# Patient Record
Sex: Female | Born: 1975 | Race: White | Hispanic: No | Smoking: Never smoker
Health system: Southern US, Community
[De-identification: ages and names within clinical notes are randomized; demographics above are authoritative.]

## PROBLEM LIST (undated history)

## (undated) DIAGNOSIS — E039 Hypothyroidism, unspecified: Secondary | ICD-10-CM

## (undated) DIAGNOSIS — E079 Disorder of thyroid, unspecified: Secondary | ICD-10-CM

## (undated) DIAGNOSIS — F329 Major depressive disorder, single episode, unspecified: Secondary | ICD-10-CM

## (undated) DIAGNOSIS — F32A Depression, unspecified: Secondary | ICD-10-CM

---

## 2009-06-19 ENCOUNTER — Ambulatory Visit: Payer: Self-pay | Admitting: Unknown Physician Specialty

## 2012-04-20 ENCOUNTER — Observation Stay: Payer: Self-pay | Admitting: Obstetrics and Gynecology

## 2012-04-27 ENCOUNTER — Inpatient Hospital Stay: Payer: Self-pay | Admitting: Obstetrics and Gynecology

## 2012-04-27 LAB — CBC WITH DIFFERENTIAL/PLATELET
Basophil #: 0 10*3/uL (ref 0.0–0.1)
Basophil %: 0.5 %
Eosinophil #: 0 10*3/uL (ref 0.0–0.7)
HGB: 12.7 g/dL (ref 12.0–16.0)
Lymphocyte #: 1.1 10*3/uL (ref 1.0–3.6)
Lymphocyte %: 14 %
MCV: 93 fL (ref 80–100)
Monocyte #: 0.5 x10 3/mm (ref 0.2–0.9)
Monocyte %: 6.2 %
Neutrophil #: 6.3 10*3/uL (ref 1.4–6.5)
Neutrophil %: 78.9 %
Platelet: 157 10*3/uL (ref 150–440)
RBC: 3.96 10*6/uL (ref 3.80–5.20)
WBC: 8 10*3/uL (ref 3.6–11.0)

## 2015-03-03 NOTE — H&P (Signed)
L&D Evaluation:  History:   HPI 39 yo G3P0020 @ 38.2wks EDC 05/09/12 by LMP presents for evaluation of possible rupture of membranes. +FM, irregular contractions, no VB.  Preg c/b hypothyroidism.    Presents with leaking fluid    Patient's Medical History Thyroid Disease    Patient's Surgical History D&C    Medications Synthroid 123mcg    Allergies NKDA    Social History none    Family History DM, Htn, breast CA   ROS:   ROS All systems were reviewed.  HEENT, CNS, GI, GU, Respiratory, CV, Renal and Musculoskeletal systems were found to be normal.   Exam:   Vital Signs stable    General no apparent distress    Mental Status clear    Abdomen gravid, non-tender    Estimated Fetal Weight Average for gestational age    Fetal Position vtx    Pelvic 4cm per nusing staff    Mebranes Ruptured    Description clear    FHT normal rate with no decels    Ucx irregular   Impression:   Impression active labor   Plan:   Plan EFM/NST, monitor contractions and for cervical change    Comments - Gross ruputer noted, admit for PROM, if no cervical change or contraction do not increase will start pitocin   Electronic Signatures: Dorthula Nettles (MD)  (Signed 05-Jul-13 10:40)  Authored: L&D Evaluation   Last Updated: 05-Jul-13 10:40 by Dorthula Nettles (MD)

## 2015-03-03 NOTE — H&P (Signed)
L&D Evaluation:  History:   HPI 39 yo G3P0020 @ 37.2wks EDC 05/09/12 by LMP presents with vaginal bleeding.  She noted a "smear" of blood when wiping on 2 occasions today.  Denies recent intercourse.  Has not had a recent cervical exam.  +FM, no contractions.  Preg c/b hypothyroidism.    Presents with vaginal bleeding    Patient's Medical History Thyroid Disease    Patient's Surgical History Wisdom teeth    Medications Synthroid 160mcg    Allergies NKDA    Social History none    Family History DM, Htn, breast CA   ROS:   ROS All systems were reviewed.  HEENT, CNS, GI, GU, Respiratory, CV, Renal and Musculoskeletal systems were found to be normal.   Exam:   Vital Signs stable    General no apparent distress    Mental Status clear    Abdomen gravid, non-tender    Pelvic Deferred    Mebranes Intact    FHT Baseline 145, having variable-appearing decels beginning after contraction peak.  NST otherwise reactive.    Ucx irregular    Skin dry   Impression:   Impression 37.2wk IUP, vaginal bleeding   Plan:   Plan EFM/NST    Comments Will perform CST.  Disc POC with pt and husband.  If neg, will d/c home.  If pos, will admit for IOL.   Electronic Signatures: Donzetta Matters (MD)  (Signed 28-Jun-13 22:39)  Authored: L&D Evaluation   Last Updated: 28-Jun-13 22:39 by Donzetta Matters (MD)

## 2015-12-29 DIAGNOSIS — F32A Depression, unspecified: Secondary | ICD-10-CM | POA: Insufficient documentation

## 2015-12-29 DIAGNOSIS — E039 Hypothyroidism, unspecified: Secondary | ICD-10-CM | POA: Insufficient documentation

## 2015-12-30 DIAGNOSIS — L910 Hypertrophic scar: Secondary | ICD-10-CM | POA: Insufficient documentation

## 2015-12-30 DIAGNOSIS — D239 Other benign neoplasm of skin, unspecified: Secondary | ICD-10-CM | POA: Insufficient documentation

## 2016-10-09 ENCOUNTER — Ambulatory Visit
Admission: EM | Admit: 2016-10-09 | Discharge: 2016-10-09 | Disposition: A | Discharge status: Home / Self Care | Payer: BC Managed Care – PPO | Attending: Family Medicine | Admitting: Family Medicine

## 2016-10-09 DIAGNOSIS — S61311A Laceration without foreign body of left index finger with damage to nail, initial encounter: Secondary | ICD-10-CM | POA: Diagnosis not present

## 2016-10-09 HISTORY — DX: Disorder of thyroid, unspecified: E07.9

## 2016-10-09 HISTORY — DX: Depression, unspecified: F32.A

## 2016-10-09 HISTORY — DX: Major depressive disorder, single episode, unspecified: F32.9

## 2016-10-09 NOTE — Discharge Instructions (Signed)
Follow up in 7 days for suture removal  Take care  Dr. Lacinda Axon

## 2016-10-09 NOTE — ED Provider Notes (Signed)
[facility]    CSN: [id number] Arrival date & time: 10/09/16  1431  History   Chief Complaint Chief Complaint  Patient presents with  . Extremity Laceration   HPI 40 year old female presents with an extremity laceration.  Patient states that she was cutting a red pepper earlier today around 2 PM and was not paying close attention and cut the tip of her left index finger. She came indirectly for evaluation. Her wound was cleaned and the bleeding was controlled with pressure. She reports associated moderate pain. No reports of numbness or tingling. Clean wound from the result of a cleaning instrument. No other complaints or concerns at this time.  Past Medical History:  Diagnosis Date  . Depression   . Thyroid disease    History reviewed. No pertinent surgical history.  OB History    No data available     Home Medications    Prior to Admission medications   Medication Sig Start Date End Date Taking? Authorizing Provider  buPROPion (WELLBUTRIN) 100 MG tablet Take 100 mg by mouth 2 (two) times daily.   Yes Historical Provider, MD  escitalopram (LEXAPRO) 20 MG tablet Take 20 mg by mouth daily.   Yes Historical Provider, MD  levothyroxine (SYNTHROID, LEVOTHROID) 100 MCG tablet Take 100 mcg by mouth daily before breakfast.   Yes Historical Provider, MD  Multiple Vitamin (MULTIVITAMIN) tablet Take 1 tablet by mouth daily.   Yes Historical Provider, MD    Family History History reviewed. No pertinent family history.  Social History Social History  Substance Use Topics  . Smoking status: Never Smoker  . Smokeless tobacco: Never Used  . Alcohol use Yes     Comment: social   Allergies   Patient has no known allergies.  Review of Systems Review of Systems  Constitutional: Negative.   Skin: Positive for wound.  All other systems reviewed and are negative.  Physical Exam Triage Vital Signs ED Triage Vitals  Enc Vitals Group     BP 10/09/16 1449 123/70   Pulse Rate 10/09/16 1449 71     Resp 10/09/16 1449 16     Temp 10/09/16 1449 98.3 F (36.8 C)     Temp Source 10/09/16 1449 Oral     SpO2 10/09/16 1449 99 %     Weight 10/09/16 1446 150 lb (68 kg)     Height 10/09/16 1446 5\' 7"  (1.702 m)     Head Circumference --      Peak Flow --      Pain Score 10/09/16 1448 8     Pain Loc --      Pain Edu? --      Excl. in [place]? --    Updated Vital Signs BP 123/70 (BP Location: Right Arm)   Pulse 71   Temp 98.3 F (36.8 C) (Oral)   Resp 16   Ht 5\' 7"  (1.702 m)   Wt 150 lb (68 kg)   LMP 09/19/2016   SpO2 99%   BMI 23.49 kg/m    Physical Exam  Constitutional: She is oriented to person, place, and time. She appears well-developed. No distress.  HENT:  Head: Normocephalic and atraumatic.  Eyes: Conjunctivae are normal.  Neck: Normal range of motion.  Pulmonary/Chest: Effort normal.  Abdominal: She exhibits no distension.  Musculoskeletal: Normal range of motion.  Neurological: She is alert and oriented to person, place, and time.  Skin: Skin is warm. Capillary refill takes less than 2 seconds.  Left 1st digit -  curvilinear superficial laceration which affects the nail as well. Medial side of the left first digit just above the DIP joint. Approximately 4 mm deep. Affects the lateral nail as well.  Psychiatric: She has a normal mood and affect.  Vitals reviewed.  UC Treatments / Results  Labs (all labs ordered are listed, but only abnormal results are displayed) Labs Reviewed - No data to display  EKG  EKG Interpretation None      Radiology No results found.  Procedures .Marland KitchenLaceration Repair Date/Time: 10/09/2016 3:54 PM Performed by: Coral Spikes Authorized by: Coral Spikes   Consent:    Consent obtained:  Verbal   Consent given by:  Patient   Alternatives discussed:  Observation and no treatment Anesthesia (see MAR for exact dosages):    Anesthesia method:  Local infiltration   Local anesthetic:  Lidocaine 1% w/o  epi Laceration details:    Location:  Finger   Finger location:  L index finger   Length (cm):  1   Depth (mm):  4 Repair type:    Repair type:  Simple Pre-procedure details:    Preparation:  Patient was prepped and draped in usual sterile fashion Exploration:    Hemostasis achieved with:  Direct pressure   Contaminated: no   Treatment:    Area cleansed with:  Betadine   Amount of cleaning:  Standard Skin repair:    Repair method:  Sutures   Suture size:  5-0   Suture material:  Nylon   Suture technique:  Simple interrupted   Number of sutures:  3 Approximation:    Approximation:  Close Post-procedure details:    Dressing:  Antibiotic ointment, non-adherent dressing and splint for protection   Patient tolerance of procedure:  Tolerated well, no immediate complications   (including critical care time)  Medications Ordered in UC Medications - No data to display   Initial Impression / Assessment and Plan / UC Course  I have reviewed the triage vital signs and the nursing notes.  Pertinent labs & imaging results that were available during my care of the patient were reviewed by me and considered in my medical decision making (see chart for details).  Clinical Course   40 year old female presents with a left index finger laceration. Take to the nail bed as well. Wound closed as above. Follow up in 7 days for suture removal.  Final Clinical Impressions(s) / UC Diagnoses   Final diagnoses:  Laceration of left index finger without foreign body with damage to nail, initial encounter   New Prescriptions Discharge Medication List as of 10/09/2016  3:39 PM       Coral Spikes, DO 10/09/16 1604

## 2016-10-09 NOTE — ED Triage Notes (Signed)
Pt reports cutting her left index finger with a kitchen knife while cutting a pepper. Bleeding controlled in triage. Pain 8/10.

## 2016-10-14 ENCOUNTER — Ambulatory Visit
Admission: EM | Admit: 2016-10-14 | Discharge: 2016-10-14 | Disposition: A | Discharge status: Home / Self Care | Payer: BC Managed Care – PPO

## 2016-10-16 ENCOUNTER — Ambulatory Visit
Admission: EM | Admit: 2016-10-16 | Discharge: 2016-10-16 | Disposition: A | Discharge status: Home / Self Care | Payer: BC Managed Care – PPO

## 2016-10-16 DIAGNOSIS — Z4802 Encounter for removal of sutures: Secondary | ICD-10-CM | POA: Diagnosis not present

## 2016-10-16 NOTE — ED Triage Notes (Signed)
Pt with left index sutures x 3 intact. No sx of infection. Denies pain

## 2016-10-16 NOTE — ED Notes (Signed)
3 sutures removed from left index finger. Bacitracin and bandaid applied

## 2017-02-25 DIAGNOSIS — G2581 Restless legs syndrome: Secondary | ICD-10-CM | POA: Insufficient documentation

## 2017-10-23 ENCOUNTER — Other Ambulatory Visit: Payer: Self-pay

## 2017-10-23 ENCOUNTER — Ambulatory Visit (INDEPENDENT_AMBULATORY_CARE_PROVIDER_SITE_OTHER): Payer: BC Managed Care – PPO

## 2017-10-23 ENCOUNTER — Ambulatory Visit
Admission: EM | Admit: 2017-10-23 | Discharge: 2017-10-23 | Disposition: A | Discharge status: Home / Self Care | Payer: BC Managed Care – PPO | Attending: Emergency Medicine | Admitting: Emergency Medicine

## 2017-10-23 ENCOUNTER — Encounter: Payer: Self-pay | Admitting: Emergency Medicine

## 2017-10-23 DIAGNOSIS — S29011A Strain of muscle and tendon of front wall of thorax, initial encounter: Secondary | ICD-10-CM

## 2017-10-23 DIAGNOSIS — R05 Cough: Secondary | ICD-10-CM

## 2017-10-23 MED ORDER — NAPROXEN 500 MG PO TABS: 500.0000 mg | ORAL_TABLET | Freq: Two times a day (BID) | ORAL | 0 refills | Status: AC

## 2017-10-23 NOTE — Discharge Instructions (Signed)
Heat alternating with ice 20 minutes out of every 2 hours 4-5 times daily for pain.  Be certain to cough and deep breathe several times each hour with holding of the rib area with your hand.

## 2017-10-23 NOTE — ED Provider Notes (Signed)
[facility]    CSN: [id number] Arrival date & time: 10/23/17  1019     History   Chief Complaint Chief Complaint  Patient presents with  . Chest Pain    APPT rib pain    HPI Mallory Simmons is a 41 y.o. female.   HPI  Is a 60-year-old female who presents with right-sided rib pain indicating lower ribs just below her right breast radicular to anterior axillary line.  He has had had pain for about 4 days he has not been taking deep breaths the discomfort.  Had a cough for over a month.  She was diagnosed with bronchitis her PCP on or about 10/09/2017.  She is a non-smoker.  She states that she is comfortable at rest but whenever she coughs or moves a certain way she will have the discomfort.  No fever or chills.  The cough is productive of green sputum.       Past Medical History:  Diagnosis Date  . Depression   . Thyroid disease     There are no active problems to display for this patient.   History reviewed. No pertinent surgical history.  OB History    No data available       Home Medications    Prior to Admission medications   Medication Sig Start Date End Date Taking? Authorizing Provider  DULoxetine (CYMBALTA) 60 MG capsule Take 60 mg by mouth daily.   Yes [provider]  levothyroxine (SYNTHROID, LEVOTHROID) 100 MCG tablet Take 100 mcg by mouth daily before breakfast.   Yes [provider]  Multiple Vitamin (MULTIVITAMIN) tablet Take 1 tablet by mouth daily.   Yes [provider]  naproxen (NAPROSYN) 500 MG tablet Take 1 tablet (500 mg total) by mouth 2 (two) times daily with a meal. 10/23/17   Lorin Picket, PA-C    Family History Family History  Problem Relation Age of Onset  . Breast cancer Mother   . Hypertension Mother   . Prostate cancer Father     Social History Social History   Tobacco Use  . Smoking status: Never Smoker  . Smokeless tobacco: Never Used  Substance Use Topics  . Alcohol use:  Yes    Comment: social  . Drug use: No     Allergies   Patient has no known allergies.   Review of Systems Review of Systems  Constitutional: Positive for activity change. Negative for chills, fatigue and fever.  Cardiovascular: Positive for chest pain.  All other systems reviewed and are negative.    Physical Exam Triage Vital Signs ED Triage Vitals [10/23/17 1031]  Enc Vitals Group     BP 118/84     Pulse Rate 92     Resp 16     Temp 98.1 F (36.7 C)     Temp Source Oral     SpO2 100 %     Weight 154 lb (69.9 kg)     Height 5\' 7"  (1.702 m)     Head Circumference      Peak Flow      Pain Score 2     Pain Loc      Pain Edu?      Excl. in [place]?    No data found.  Updated Vital Signs BP 118/84 (BP Location: Left Arm)   Pulse 92   Temp 98.1 F (36.7 C) (Oral)   Resp 16   Ht 5\' 7"  (1.702 m)   Wt  154 lb (69.9 kg)   LMP 10/17/2017 Comment: denies preg  SpO2 100%   BMI 24.12 kg/m   Visual Acuity Right Eye Distance:   Left Eye Distance:   Bilateral Distance:    Right Eye Near:   Left Eye Near:    Bilateral Near:     Physical Exam  Constitutional: She is oriented to person, place, and time. She appears well-developed and well-nourished.  Non-toxic appearance. She does not appear ill. No distress.  HENT:  Head: Normocephalic.  Eyes: Pupils are equal, round, and reactive to light.  Neck: Normal range of motion.  Pulmonary/Chest: Effort normal and breath sounds normal.  Musculoskeletal: Normal range of motion.  Examination of the chest was performed with Nevin Bloodgood, CMA as Education officer, museum.  Patient has discomfort with a compression of the sternum with radiation of the pain along the right lower ribs just inferior to the breast in the mid clavicle to anterior axillary line.  Maximal tenderness is also in this area but mostly over the anterior axillary line.  There is no crepitus to auscultation over the area of maximal tenderness.  Neurological: She is alert  and oriented to person, place, and time.  Skin: Skin is warm and dry.  Psychiatric: She has a normal mood and affect. Her behavior is normal.  Nursing note and vitals reviewed.    UC Treatments / Results  Labs (all labs ordered are listed, but only abnormal results are displayed) Labs Reviewed - No data to display  EKG  EKG Interpretation None       Radiology Dg Ribs Unilateral W/chest Right  Result Date: 10/23/2017 CLINICAL DATA:  One month history of bronchitis with persistent productive cough. Pleuritic right insurer lateral lower ribcage pain related to coughing episode. EXAM: RIGHT RIBS AND CHEST - 3+ VIEW COMPARISON:  None in PACs FINDINGS: The lungs are well-expanded and clear. There is no pleural effusion, pneumothorax, or pneumomediastinum. The heart and mediastinal structures are normal. There is gentle curvature of the midthoracic spine convex toward the right. Right rib detail views include a metallic BB over the symptomatic lower lateral ribs. Deep to this no acute displaced or nondisplaced fracture is observed. IMPRESSION: There is no pneumonia nor other acute cardiopulmonary abnormality. No right rib fracture is observed. Electronically Signed   By: David  Martinique M.D.   On: 10/23/2017 11:20    Procedures Procedures (including critical care time)  Medications Ordered in UC Medications - No data to display   Initial Impression / Assessment and Plan / UC Course  I have reviewed the triage vital signs and the nursing notes.  Pertinent labs & imaging results that were available during my care of the patient were reviewed by me and considered in my medical decision making (see chart for details).     Plan: 1. Test/x-ray results and diagnosis reviewed with patient 2. rx as per orders; risks, benefits, potential side effects reviewed with patient 3. Recommend supportive treatment with heat alternating with ice over the area for comfort.  He is instructed in proper  cough and deep breathe pulmonary toilet using her hand for support over the area of maximal tenderness.  Her chest x-ray was reviewed with the patient.  She continues to have problems she should follow-up with her primary care physician.  There is no evidence of pneumothorax hemothorax or pneumonia present. 4. F/u prn if symptoms worsen or don't improve   Final Clinical Impressions(s) / UC Diagnoses   Final diagnoses:  Intercostal muscle  strain, initial encounter    ED Discharge Orders        Ordered    naproxen (NAPROSYN) 500 MG tablet  2 times daily with meals     10/23/17 1137       Controlled Substance Prescriptions [place] Controlled Substance Registry consulted? Not Applicable   Lorin Picket, PA-C 10/23/17 1144

## 2017-10-23 NOTE — ED Notes (Signed)
Bed: MUC03 Expected date: 10/23/17 Expected time: 10:29 AM Means of arrival:  Comments: Mallory Simmons online appt.

## 2017-10-23 NOTE — ED Triage Notes (Signed)
Patient in today c/o right sided rib pain and unable to take a deep breath without pain x 4 days. Patient was diagnosed with bronchitis 10/09/17 at her PCP. She states she started with a cough about 1 week prior to that.

## 2017-12-08 ENCOUNTER — Other Ambulatory Visit: Payer: Self-pay | Admitting: Obstetrics and Gynecology

## 2019-03-19 IMAGING — CR DG RIBS W/ CHEST 3+V*R*
5 series · 5 of 5 positions shown · non-contrast
Comparison: None in PACs

CLINICAL DATA: One month history of bronchitis with persistent
productive cough. Pleuritic right insurer lateral lower ribcage pain
related to coughing episode.

EXAM:
RIGHT RIBS AND CHEST - 3+ VIEW

[chest pa]
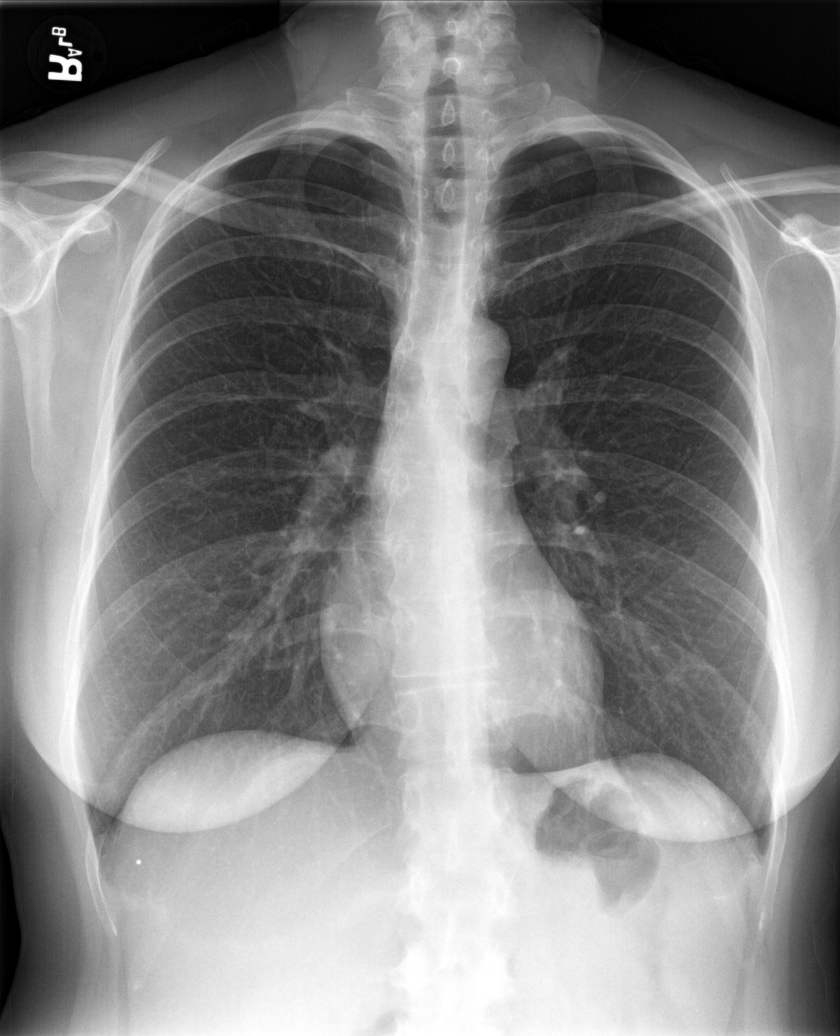

[rib pa (1 of 2)]
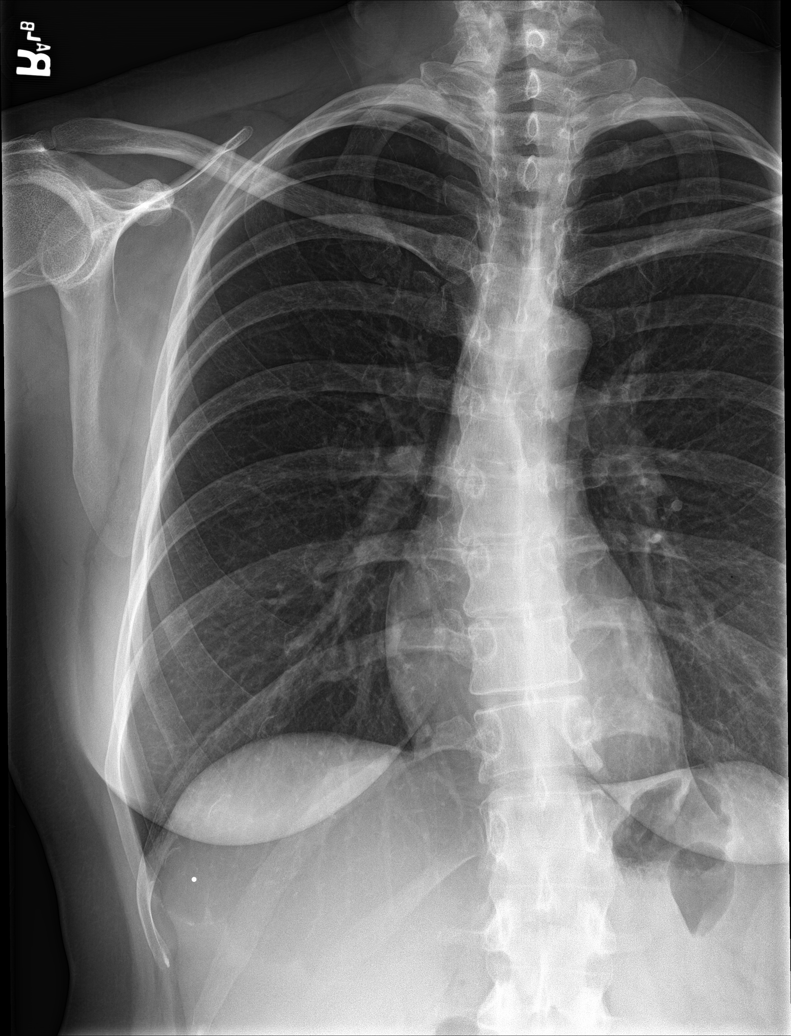

[rib pa (2 of 2)]
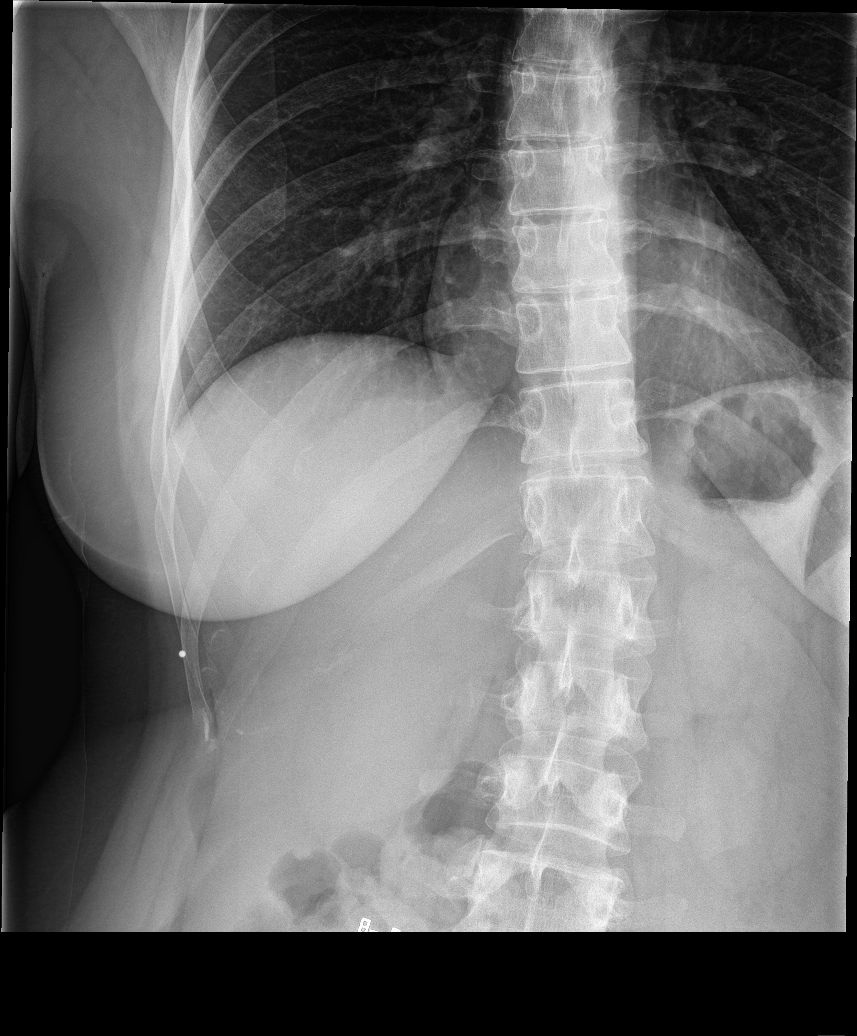

[rib obl (1 of 2)]
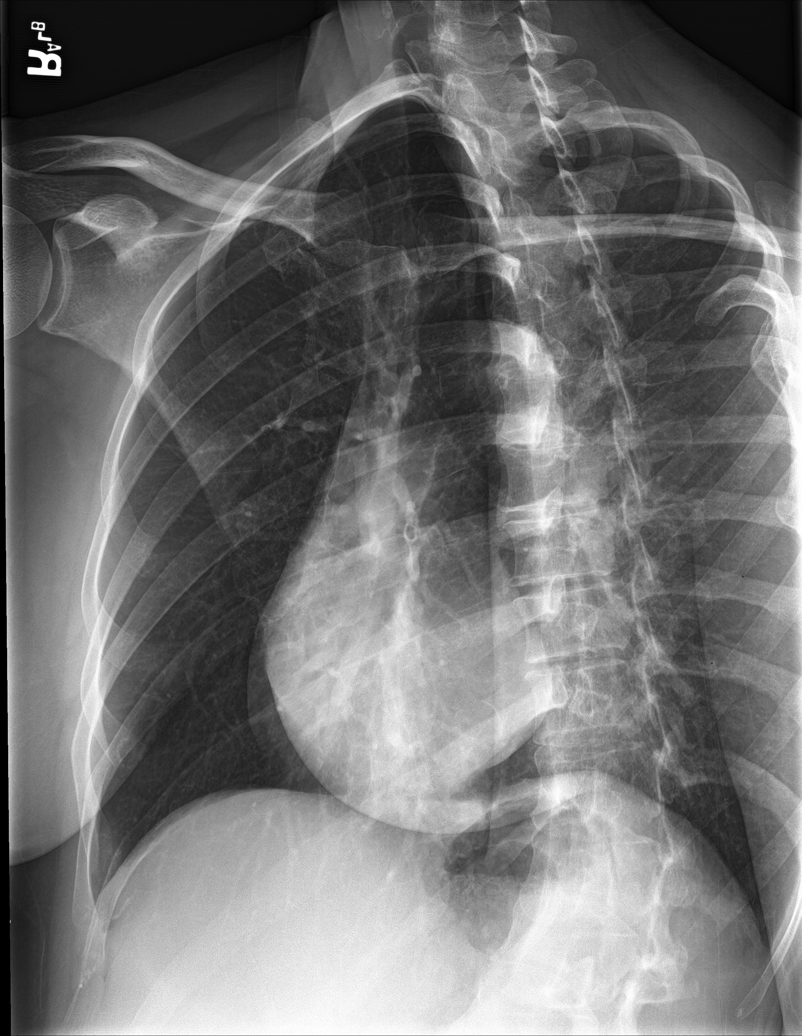

[rib obl (2 of 2)]
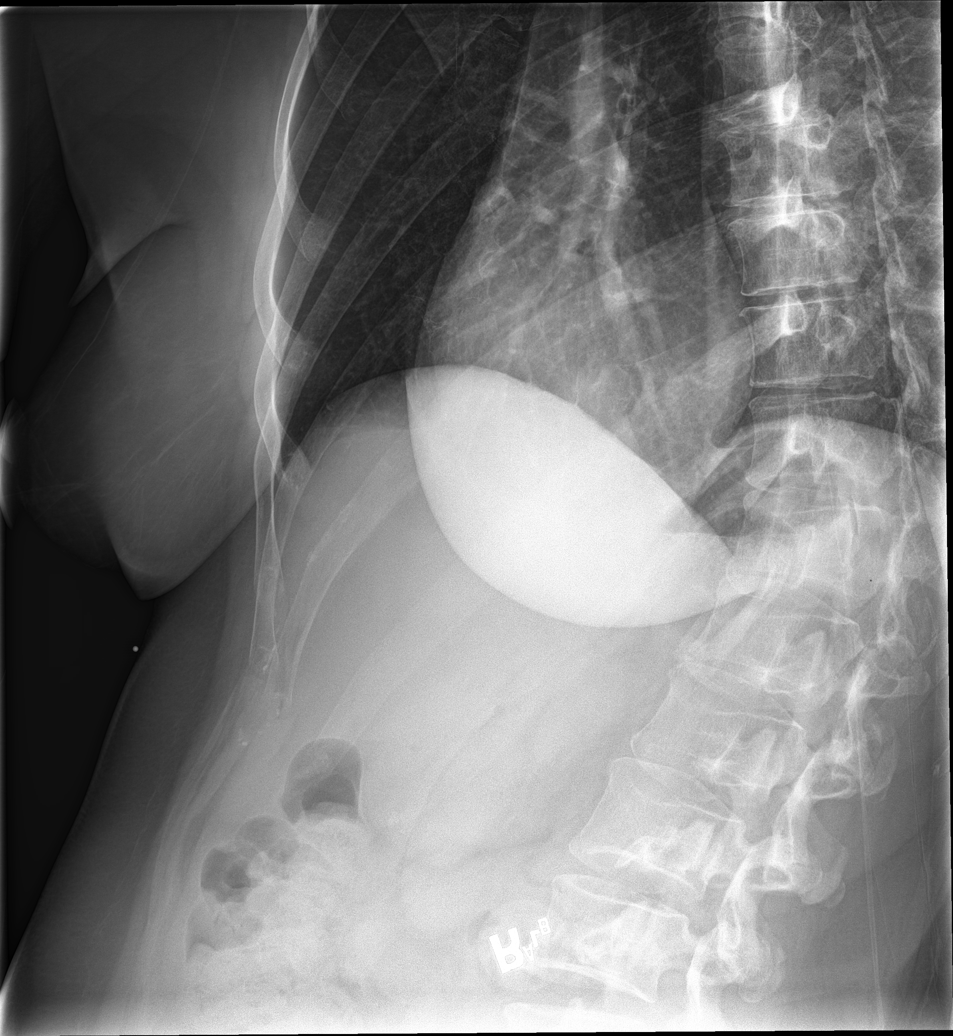

[5 of 5 positions shown; findings below may reference images not displayed]

FINDINGS: The lungs are well-expanded and clear. There is no pleural effusion,
pneumothorax, or pneumomediastinum. The heart and mediastinal
structures are normal. There is gentle curvature of the midthoracic
spine convex toward the right.

Right rib detail views include a metallic BB over the symptomatic
lower lateral ribs. Deep to this no acute displaced or nondisplaced
fracture is observed.
IMPRESSION: There is no pneumonia nor other acute cardiopulmonary abnormality.

No right rib fracture is observed.

## 2019-06-19 DIAGNOSIS — Z1589 Genetic susceptibility to other disease: Secondary | ICD-10-CM | POA: Insufficient documentation

## 2020-07-21 DIAGNOSIS — L709 Acne, unspecified: Secondary | ICD-10-CM | POA: Insufficient documentation

## 2020-10-02 DIAGNOSIS — N924 Excessive bleeding in the premenopausal period: Secondary | ICD-10-CM | POA: Insufficient documentation

## 2021-09-20 ENCOUNTER — Other Ambulatory Visit: Payer: Self-pay

## 2021-09-20 ENCOUNTER — Ambulatory Visit: Payer: BC Managed Care – PPO | Attending: Family Medicine

## 2021-09-20 DIAGNOSIS — M6281 Muscle weakness (generalized): Secondary | ICD-10-CM | POA: Diagnosis present

## 2021-09-20 DIAGNOSIS — M25551 Pain in right hip: Secondary | ICD-10-CM | POA: Diagnosis present

## 2021-09-20 NOTE — Therapy (Signed)
[facility] [address] [place], [place], [address] Phone: [phone]   Fax:  [phone]  Physical Therapy Evaluation  Patient Details  Name: Mallory Simmons MRN: [id number] Date of Birth: Oct 10, 1976 Referring Provider (PT): Dr Sara Chu  Encounter Date: 09/20/2021   PT End of Session - 09/20/21 1214     Visit Number 1    Number of Visits 7    Date for PT Re-Evaluation 11/01/21    Authorization Type eval: 09/20/21, [place]  Ded[place] and OOP met- covered @ 100% until 10/23/21  no auth req    PT Start Time 1020    PT Stop Time 1110    PT Time Calculation (min) 50 min    Activity Tolerance Patient tolerated treatment well    Behavior During Therapy [facility] for tasks assessed/performed             Past Medical History:  Diagnosis Date   Depression    Thyroid disease     History reviewed. No pertinent surgical history.  There were no vitals filed for this visit.    Subjective Assessment - 09/20/21 1134     Subjective R hip pain    Pertinent History During the night of 09/12/21 pt was getting up to go to the bathroom. She stepped with left leg to avoid her cat and she hyperextend her R hip. She had immediate pain in her R hip but no popping or locking. Immediately afterward she had difficulty putting weight on her RLE. She also had warmth in her right hip but no swelling or bruising. She denies any weakness in her right hip or numbness/tingling in RLE. She saw her PCP and used ibuprofen regularly all last week. By Saturday her hip was feeling much better. Pt is very active and she likes to run and hike. She also works out with a Physiological scientist twice/week. During personal training she performs a lot of lunges, squats, and RDLs. She does not do plyometric exercises with the trainer. Pt works at [employer] at [employer]. She reports pain in R hip once she stands up if she sits for extended periods of time  at work. No history of prior R hip injury or surgery. Denies back pain but reports diagnosis of mild scoliosis which was noted when she had her epidural during her son's birth. Review of systems is negative for red flags (bowel/bladder changes, saddle paresthesia, personal history of cancer, chills/fever, night sweats, unrelenting pain).    Limitations Other (comment)   running, exercise   How long can you sit comfortably? No limitation only pain when standing after extended period sitting    How long can you walk comfortably? No limitation    Diagnostic tests No imaging    Patient Stated Goals "I want to know if I can do a 5K in December." Patient's main goal is return to regular exercise    Currently in Pain? No/denies    Pain Score 0-No pain   Worst: 2/10, Best: 0/10   Pain Location Hip    Pain Orientation Right;Anterior;Posterior    Pain Descriptors / Indicators Sore;Other (Comment)   "twingy"   Pain Type Acute pain    Pain Radiating Towards Initially some radiating pain down anterior right thigh and into posterior hip but has resolved    Pain Onset In the past 7 days    Pain Frequency Intermittent    Aggravating Factors  Extended sitting, lunging, twisting of  the hip, hip extension    Pain Relieving Factors heat, ibuprofen                [facility] PT Assessment - 09/20/21 1144       Assessment   Medical Diagnosis Inguinal muscle strain    Referring Provider (PT) Dr Sara Chu    Onset Date/Surgical Date 09/12/21    Hand Dominance Right    Next MD Visit As needed    Prior Therapy None for this issue      Precautions   Precautions None      Restrictions   Weight Bearing Restrictions No      Balance Screen   Has the patient fallen in the past 6 months No      [place] residence    Living Arrangements Children    Type of Home Apartment      Prior Function   Level of Independence Independent    Vocation Full time employment     Vocation Requirements Works as a Licensed conveyancer at [employer], running, exercising      Cognition   Overall Cognitive Status Within Functional Limits for tasks assessed              SUBJECTIVE Chief complaint: R hip pain History: During the night of 09/12/21 pt was getting up to go to the bathroom. She stepped with left leg to avoid her cat and she hyperextend her R hip. She had immediate pain in her R hip but no popping or locking. Immediately afterward she had difficulty putting weight on her RLE. She also had warmth in her right hip but no swelling or bruising. She denies any weakness in her right hip or numbness/tingling in RLE. She saw her PCP and used ibuprofen regularly all last week. By Saturday her hip was feeling much better. Pt is very active and she likes to run and hike. She also works out with a Physiological scientist twice/week. During personal training she performs a lot of lunges, squats, and RDLs. She does not do plyometric exercises with the trainer. Pt works at [employer] at [employer]. She reports pain in R hip once she stands up if she sits for extended periods of time at work. No history of prior R hip injury or surgery. Denies back pain but reports diagnosis of mild scoliosis which was noted when she had her epidural during her son's birth. Review of systems is negative for red flags (bowel/bladder changes, saddle paresthesia, personal history of cancer, chills/fever, night sweats, unrelenting pain). Pain location: R hip/groin Pain: Present 0/10, Best 0/10, Worst 2/10 Pain quality: "twingy," sore Radiating pain: No , Initially some radiating pain down anterior right thigh and into posterior hip but has resolved Numbness/Tingling: No 24 hour pain behavior: No pattern Aggravating factors: Extended sitting, lunging, twisting of the hip, hip extension Easing factors: heat, ibuprofen History of prior back injury, pain, surgery, or therapy: No Follow-up  appointment with MD: No, as needed Dominant hand: right Imaging: No  Falls in the last 6 months: No  Occupational demands: Hobbies: Goals: "I want to know if I can do a 5K in December." Patient's main goal is return to regular exercise   OBJECTIVE  Mental Status Patient is oriented to person, place and time.  Recent memory is intact.  Remote memory is intact.  Attention span and concentration are intact.  Expressive speech is intact.  Patient's fund of knowledge  is within normal limits for educational level.   Gross Musculoskeletal Assessment Tremor: None Bulk: Normal Tone: Normal No visible step-off along spinal column, mild L thoracic curve noted with very mild rib elevation   Gait Deferred full gait assessment   Posture Lumbar lordosis: WNL Iliac crest height: equal bilaterally Lumbar lateral shift: negative Lower crossed syndrome (tight hip flexors and erector spinae; weak gluts and abs): negative   AROM (degrees) R/L (all movements include overpressure unless otherwise stated) Lumbar forward flexion (65): WNL Lumbar extension (30): WNL, mild pain in R hip Lumbar lateral flexion (25): R: WNL  L: WNL  Thoracic and Lumbar rotation (30 degrees):  R: WNL L: WNL Hip IR (0-45): WNL bilateral; Hip ER (0-45): WNL bilateral; Hip Flexion (0-125): slightly limited on the R hip secondary to pain  Hip Abduction (0-40): R: slightly limited due to pain, L: WNL *Indicates pain   Repeated Movements Deferred   Strength (out of 5) R/L 4*/4+ Hip flexion 5*/5 Hip ER 5/5 Hip IR 4/4 Hip abduction 4*/4 Hip adduction 4+*/4+ Hip extension 5/5 Knee extension 5/5 Knee flexion 5/5 Ankle dorsiflexion Active bilateral ankle plantarflexion 5 Trunk flexion *Indicates pain   Sensation Grossly intact to light touch bilateral LEs as determined by testing dermatomes L2-S2. Proprioception and hot/cold testing deferred on this date.   Reflexes Deferred   Palpation Graded  on 0-4 scale (0 = no pain, 1 = pain, 2 = pain with wincing/grimacing/flinching, 3 = pain with withdrawal, 4 = unwilling to allow palpation) Location LEFT  RIGHT           Lumbar paraspinals 0 0  Quadratus Lumborum NT NT  Gluteus Maximus 0 1  Gluteus Medius 0 0  Deep hip external rotators 0 1  PSIS 0 0  Fortin's Area (SIJ) 0 1  Greater Trochanter 0 0  Iliopsoas Insertion 0 2  Iliacus 0 2     Muscle Length Hamstrings: Approximately 90 degrees bilaterally, no pain  Ely: WNL Thomas: Deferred Ober: Deferred    Passive Accessory Intervertebral Motion (PAIVM) Pt denies reproduction of back pain with CPA L1-L5 and UPA bilaterally L1-L5. No gross hypomobility noted in lumbar spine    SPECIAL TESTS Lumbar Radiculopathy and Discogenic: Centralization and Peripheralization (SN 92, -LR 0.12): Not examined Slump (SN 83, -LR 0.32): R: Negative L: Negative SLR (SN 92, -LR 0.29): R: Negative L:  Negative Crossed SLR (SP 90): R: Negative L: Negative   Facet Joint: Extension-Rotation (SN 100, -LR 0.0): R: Negative L: Negative   Lumbar Foraminal Stenosis: Lumbar quadrant (SN 70): R: Negative L: Negative   Hip: FABER (SN 81): R: Negative L: Negative FADIR (SN 94): R: Positive L: Negative Hip scour (SN 50): R: Negative L: Negative   SIJ:  Thigh Thrust (SN 88, -LR 0.18) : R: Not examined L: Not examined   Piriformis Syndrome: FAIR Test (SN 88, SP 83): R: Not examined L: Not examined   Functional Tasks Deferred                Objective measurements completed on examination: See above findings.        TREATMENT   Ther-ex  HEP issued and exercises explained and/or demonstrated with return performance by patient and verbal/tactile correction by therapist. Seated R hip FABER and FADIR stretch; Seated clams and adductor squeeze Standing R hip flexor runners stretch;  Instruction about seated march with resistance; Green, blue, and gray therabands  provided;        PT Education -  09/20/21 1153     Education Details Plan of care and HEP    Person(s) Educated Patient    Methods Explanation;Demonstration;Handout    Comprehension Verbalized understanding;Returned demonstration              PT Short Term Goals - 09/20/21 1156       PT SHORT TERM GOAL #1   Title Pt will be independent with HEP to improve strength and decrease R hip pain in order to improve pain-free function at home, work, and with exercise    Time 3    Period Weeks    Status New    Target Date 10/11/21               PT Long Term Goals - 09/20/21 1159       PT LONG TERM GOAL #1   Title Pt will report no further R hip pain with running, hiking, and working out with her personal trainer    Time 6    Period Weeks    Status New    Target Date 11/01/21      PT LONG TERM GOAL #2   Title Pt will increase FOTO to at least 90 in order to demonstrate significant improvement in function related to her R hip pain    Baseline 09/20/21: 72    Time 6    Period Weeks    Status New    Target Date 11/01/21      PT LONG TERM GOAL #3   Title Pt will increase pain free R hip abduction strength by at least 1/2 MMT grade in order to demonstrate improvement in strength and function.    Baseline 09/20/21: 4/5 and painful    Time 6    Period Weeks    Status New    Target Date 11/01/21      PT LONG TERM GOAL #4   Title Pt will increase LEFS by at least 9 points in order to demonstrate significant improvement in lower extremity function related to her R hip pain.    Baseline 09/20/21: 69/80    Time 6    Period Weeks    Status New    Target Date 11/01/21                    Plan - 09/20/21 1154     Clinical Impression Statement Pt is a pleasant 45 year-old female referred for R inguinal muscle strain. Pt is already reporting significant improvement in her symptoms since the injury. Examination today reveals tenderness to distal iliopsoas  insertion as well as pain with palpation to medial thigh (adductors) and deep posterior right hip (external rotators). Painful resisted R hip flexion, adduction, external rotation, and extension consistent with muscle strains. Pt also reports pain with passive R hip FADIR and passive abduction (pain reported in inner thigh). No focal deficits in strength or sensation with the exception of some weakness in R hip flexion related to pain. Pt presents with R hip pain and will benefit from skilled PT services to return to pain-free function at home, work, and with leisure activities such as running, hiking, and exercising.    Personal Factors and Comorbidities Comorbidity 1    Comorbidities Depression    Examination-Activity Limitations Squat    Examination-Participation Restrictions Community Activity;Other;Occupation   Exercise/running/hiking   Stability/Clinical Decision Making Stable/Uncomplicated    Clinical Decision Making Low    Rehab Potential Excellent    PT Frequency 1x / week  PT Duration 6 weeks    PT Treatment/Interventions ADLs/Self Care Home Management;Aquatic Therapy;Biofeedback;Canalith Repostioning;Cryotherapy;Electrical Stimulation;Iontophoresis 35m/ml Dexamethasone;Moist Heat;Traction;Ultrasound;Gait training;Stair training;Functional mobility training;Therapeutic activities;Therapeutic exercise;Balance training;Neuromuscular re-education;Patient/family education;Manual techniques;Passive range of motion;Dry needling;Vestibular;Spinal Manipulations;Joint Manipulations    PT Next Visit Plan Review HEP, progress R hip manual techniques and strengthening    PT Home Exercise Plan Access Code: [email or web address]    Consulted and Agree with Plan of Care Patient             Patient will benefit from skilled therapeutic intervention in order to improve the following deficits and impairments:  Pain, Decreased strength  Visit Diagnosis: Pain in right hip  Muscle weakness  (generalized)     Problem List There are no problems to display for this patient.  JPhillips GroutPT, DPT, GCS  Logyn Kendrick, PT 09/20/2021, 4:15 PM  [place] [facility] [place] [address]Phone: [phone]  Fax:  [phone] Name: Mallory DONAMRN: [id number] of Birth: 123-Mar-1977

## 2021-09-20 NOTE — Patient Instructions (Signed)
Access Code: [email or web address] URL: [email or web address] Date: 09/20/2021 Prepared by: Roxana Hires  Exercises Standing Hip Flexor Stretch - 2-3 x daily - 7 x weekly - 3 reps - 30-45s hold Seated Figure 4 Piriformis Stretch (Mirrored) - 2-3 x daily - 7 x weekly - 3 reps - 30-45s hold Seated Piriformis Stretch (Mirrored) - 2-3 x daily - 7 x weekly - 3 reps - 30-45s hold Seated Hip Adduction Isometrics with Ball - 1 x daily - 7 x weekly - 2 sets - 10 reps - 3s hold Seated Hip Abduction with Resistance - 1 x daily - 7 x weekly - 2 sets - 10 reps - 3s hold Seated March with Resistance - 1 x daily - 7 x weekly - 2 sets - 10 reps - 3s hold

## 2021-09-26 NOTE — Addendum Note (Signed)
Addended by: Roxana Hires D on: 09/26/2021 02:56 PM   Modules accepted: Orders

## 2021-09-27 ENCOUNTER — Other Ambulatory Visit: Payer: Self-pay

## 2021-09-27 ENCOUNTER — Ambulatory Visit: Payer: BC Managed Care – PPO | Attending: Family Medicine | Admitting: Physical Therapy

## 2021-09-27 DIAGNOSIS — M25551 Pain in right hip: Secondary | ICD-10-CM | POA: Insufficient documentation

## 2021-09-27 DIAGNOSIS — M6281 Muscle weakness (generalized): Secondary | ICD-10-CM | POA: Diagnosis present

## 2021-09-27 NOTE — Therapy (Signed)
[facility] [address] [place], [place], [address] Phone: [phone]   Fax:  [phone]  Physical Therapy Treatment  Patient Details  Name: Mallory Simmons MRN: [id number] Date of Birth: [birth year] Referring Provider (PT): Dr Sara Chu   Encounter Date: 09/27/2021   PT End of Session - 09/27/21 1238     Visit Number 2    Number of Visits 7    Date for PT Re-Evaluation 11/01/21    Authorization Type eval: 09/20/21, [place]  Ded[place] and OOP met- covered @ 100% until 10/23/21  no auth req    PT Start Time 1100    PT Stop Time 1143    PT Time Calculation (min) 43 min    Activity Tolerance Patient tolerated treatment well    Behavior During Therapy [facility] for tasks assessed/performed             Past Medical History:  Diagnosis Date   Depression    Thyroid disease     History reviewed. No pertinent surgical history.  There were no vitals filed for this visit.   Subjective Assessment - 09/27/21 1104     Subjective Pt reports she is well today; she states she is afriad she may have over did it with activity last week. She worked out with her Physiological scientist this morning and went for a run last Friday. Reports no pain during running however mild soreness after the run in the lateral hip. A mild "twingy" pain also reported with squatting 20# through full ROM. Pt reports compliance with HEP; no questions at this time.    Pertinent History During the night of 09/12/21 pt was getting up to go to the bathroom. She stepped with left leg to avoid her cat and she hyperextend her R hip. She had immediate pain in her R hip but no popping or locking. Immediately afterward she had difficulty putting weight on her RLE. She also had warmth in her right hip but no swelling or bruising. She denies any weakness in her right hip or numbness/tingling in RLE. She saw her PCP and used ibuprofen regularly all last week.  By Saturday her hip was feeling much better. Pt is very active and she likes to run and hike. She also works out with a Physiological scientist twice/week. During personal training she performs a lot of lunges, squats, and RDLs. She does not do plyometric exercises with the trainer. Pt works at [employer] at [employer]. She reports pain in R hip once she stands up if she sits for extended periods of time at work. No history of prior R hip injury or surgery. Denies back pain but reports diagnosis of mild scoliosis which was noted when she had her epidural during her son's birth. Review of systems is negative for red flags (bowel/bladder changes, saddle paresthesia, personal history of cancer, chills/fever, night sweats, unrelenting pain).    Limitations Other (comment)   running, exercise   How long can you sit comfortably? No limitation only pain when standing after extended period sitting    How long can you walk comfortably? No limitation    Diagnostic tests No imaging    Patient Stated Goals "I want to know if I can do a 5K in December." Patient's main goal is return to regular exercise    Currently in Pain? No/denies    Pain Onset In the past 7 days  TREATMENT   Manual Therapy R hip flexor stretch off side of table with light overpressure 2 x 60s; Supine R hip figure 4 stretch 2 x 60s; Supine ITB stretch 2 x 60s; Hooklying hip flexor MET 5 x 5s hold, 2 sets; Supine right hip long axis distraction 30 seconds x 2;  Ther-ex  Treadmill warm-up, 2.96mh for 5 minutes; Hooklying clams with blue RB 2 x 10 w/ 3s hold; L sidelying R clams with blue RB 2 x 10 w/ 3s hold; Bridge 2 x 10; 4-way hip, right side only, straight leg:   - Supine hip flexion (therapist supporting RLE due to mild discomfort) 2 x 10;   - L sidelying R hip abduction 2 x 10;   - Prone hip extension 2 x 10;   - R sidelying R hip adduction 2 x 10; R side plank on knees:   - hip dips x 10;   - isometric hold 3  x 10s;    Pt educated throughout session about proper posture and technique with exercises. Improved exercise technique, movement at target joints, use of target muscles after min to mod verbal, visual, tactile cues.      Pt demonstrates excellent motivation throughout today's session. Manual therapy was initiated with stretching and muscle energy techniques for the hip flexor. Positive outcomes with MET as patient was able to achieve greater ROM following the technique. Attempted supine lateral hip/glute stretch (crossing RLE over midline) - this stretch was quickly discontinued due to moderate discomfort in lateral hip. PT then reviewed current HEP; no questions or concerns. Gentle strengthening was progressed with resistance bands and body weight therapeutic exercises performed lying on the mat table. Pt completed session reporting no discomfort and feeling as well as she did when she arrived. Patient was educated on performing squats with personal trainer - advised partial ROM with BW/light weight and to avoid excessive reps. Pt will benefit from skilled PT services to return to pain-free function at home, work, and with leisure activities such as running, hiking, and exercising.                      PT Short Term Goals - 09/20/21 1156       PT SHORT TERM GOAL #1   Title Pt will be independent with HEP to improve strength and decrease R hip pain in order to improve pain-free function at home, work, and with exercise    Time 3    Period Weeks    Status New    Target Date 10/11/21               PT Long Term Goals - 09/20/21 1159       PT LONG TERM GOAL #1   Title Pt will report no further R hip pain with running, hiking, and working out with her personal trainer    Time 6    Period Weeks    Status New    Target Date 11/01/21      PT LONG TERM GOAL #2   Title Pt will increase FOTO to at least 90 in order to demonstrate significant improvement in function  related to her R hip pain    Baseline 09/20/21: 72    Time 6    Period Weeks    Status New    Target Date 11/01/21      PT LONG TERM GOAL #3   Title Pt will increase pain free R hip abduction strength by  at least 1/2 MMT grade in order to demonstrate improvement in strength and function.    Baseline 09/20/21: 4/5 and painful    Time 6    Period Weeks    Status New    Target Date 11/01/21      PT LONG TERM GOAL #4   Title Pt will increase LEFS by at least 9 points in order to demonstrate significant improvement in lower extremity function related to her R hip pain.    Baseline 09/20/21: 69/80    Time 6    Period Weeks    Status New    Target Date 11/01/21                   Plan - 09/27/21 1238     Clinical Impression Statement Pt demonstrates excellent motivation throughout today's session. Manual therapy was initiated with stretching and muscle energy techniques for the hip flexor. Positive outcomes with MET as patient was able to achieve greater ROM following the technique. Attempted supine lateral hip/glute stretch (crossing RLE over midline) - this stretch was quickly discontinued due to moderate discomfort in lateral hip. PT then reviewed current HEP; no questions or concerns. Gentle strengthening was progressed with resistance bands and body weight therapeutic exercises performed lying on the mat table. Pt completed session reporting no discomfort and feeling as well as she did when she arrived. Patient was educated on performing squats with personal trainer - advised partial ROM with BW/light weight and to avoid excessive reps. Pt will benefit from skilled PT services to return to pain-free function at home, work, and with leisure activities such as running, hiking, and exercising.    Personal Factors and Comorbidities Comorbidity 1    Comorbidities Depression    Examination-Activity Limitations Squat    Examination-Participation Restrictions Community  Activity;Other;Occupation   Exercise/running/hiking   Stability/Clinical Decision Making Stable/Uncomplicated    Rehab Potential Excellent    PT Frequency 1x / week    PT Duration 6 weeks    PT Treatment/Interventions ADLs/Self Care Home Management;Aquatic Therapy;Biofeedback;Canalith Repostioning;Cryotherapy;Electrical Stimulation;Iontophoresis 18m/ml Dexamethasone;Moist Heat;Traction;Ultrasound;Gait training;Stair training;Functional mobility training;Therapeutic activities;Therapeutic exercise;Balance training;Neuromuscular re-education;Patient/family education;Manual techniques;Passive range of motion;Dry needling;Vestibular;Spinal Manipulations;Joint Manipulations    PT Next Visit Plan Review HEP, progress R hip manual techniques and strengthening    PT Home Exercise Plan Access Code: [email or web address]    Consulted and Agree with Plan of Care Patient             Patient will benefit from skilled therapeutic intervention in order to improve the following deficits and impairments:  Pain, Decreased strength  Visit Diagnosis: Pain in right hip  Muscle weakness (generalized)     Problem List There are no problems to display for this patient.   KPatrina LeveringPT, DPT  [place] [facility] [address]Phone: [phone]  Fax:  [phone] Name: LCECILLE MCCLUSKYMRN: [id number] of Birth: 1Oct 07, 1977

## 2021-10-04 ENCOUNTER — Other Ambulatory Visit: Payer: Self-pay

## 2021-10-04 ENCOUNTER — Ambulatory Visit: Payer: BC Managed Care – PPO

## 2021-10-04 DIAGNOSIS — M25551 Pain in right hip: Secondary | ICD-10-CM

## 2021-10-04 DIAGNOSIS — M6281 Muscle weakness (generalized): Secondary | ICD-10-CM

## 2021-10-04 NOTE — Therapy (Signed)
[facility] [address] [place], [place], [address] Phone: [phone]   Fax:  [phone]  Physical Therapy Treatment  Patient Details  Name: Mallory Simmons MRN: [id number] Date of Birth: [birth year] Referring Provider (PT): Dr Sara Chu   Encounter Date: 10/04/2021   PT End of Session - 10/04/21 1459     Visit Number 3    Number of Visits 7    Date for PT Re-Evaluation 11/01/21    Authorization Type eval: 09/20/21, [place]  Ded[place]ible and OOP met- covered @ 100% until 10/23/21  no auth req    PT Start Time 1315    PT Stop Time 1400    PT Time Calculation (min) 45 min    Activity Tolerance Patient tolerated treatment well    Behavior During Therapy [facility] for tasks assessed/performed             Past Medical History:  Diagnosis Date   Depression    Thyroid disease     History reviewed. No pertinent surgical history.  There were no vitals filed for this visit.   Subjective Assessment - 10/04/21 1318     Subjective Pt reports she is well today.  She did not has any resting right hip pain upon level.  She has been able to continue running without any increase in her pain.  She has noticed some occasional pain with exercise.  She decreased the depth of her squats as well as the amount of weight and reports improvement in her hip pain.  No specific questions or concerns upon arrival.    Pertinent History During the night of 09/12/21 pt was getting up to go to the bathroom. She stepped with left leg to avoid her cat and she hyperextend her R hip. She had immediate pain in her R hip but no popping or locking. Immediately afterward she had difficulty putting weight on her RLE. She also had warmth in her right hip but no swelling or bruising. She denies any weakness in her right hip or numbness/tingling in RLE. She saw her PCP and used ibuprofen regularly all last week. By Saturday her hip was feeling much  better. Pt is very active and she likes to run and hike. She also works out with a Physiological scientist twice/week. During personal training she performs a lot of lunges, squats, and RDLs. She does not do plyometric exercises with the trainer. Pt works at [employer] at [employer]. She reports pain in R hip once she stands up if she sits for extended periods of time at work. No history of prior R hip injury or surgery. Denies back pain but reports diagnosis of mild scoliosis which was noted when she had her epidural during her son's birth. Review of systems is negative for red flags (bowel/bladder changes, saddle paresthesia, personal history of cancer, chills/fever, night sweats, unrelenting pain).    Limitations Other (comment)   running, exercise   How long can you sit comfortably? No limitation only pain when standing after extended period sitting    How long can you walk comfortably? No limitation    Diagnostic tests No imaging    Patient Stated Goals "I want to know if I can do a 5K in December." Patient's main goal is return to regular exercise    Currently in Pain? No/denies    Pain Onset --                TREATMENT  Manual Therapy R hip flexor stretch off side of table with light overpressure 2 x 60s; Supine R hip figure 4 stretch 2 x 60s; Supine R hip piriformis cross body stretch 2 x 60s; Supine R hip belt assisted distraction, 30 seconds x 3; Supine right hip belt assisted superior to inferior mobilizations with hip flexed at 90 degrees, grade II-III, 30s/bout x 3 bouts; Supine right hip belt assisted medial to lateral mobilizations with hip flexed 45 degrees, grade II-III, 30 seconds/bout x3 bouts; Supine right hip belt assisted superior to inferior mobilizations with right lower extremity in FABER position, grade II-III, 30s/bout x 3 bouts; Distal iliopsoas trigger point release;   Ther-ex  Treadmill warm-up walking forward, R lateral, L lateral, and backwards x 8 minutes  during interval history; Supine right hip eccentric straight knee lowering x10 (added to HEP); Hook lying bridges with alternating marches x10 (added to HEP); Side-lying knee bridges with upper leg clam and manual resistance from therapist 2x10 on each side; HEP updated, handout provided to patient;   Trigger Point Dry Needling (TDN), unbilled Education performed with patient regarding potential benefit of TDN. Reviewed precautions and risks with patient. Pt provided verbal consent to treatment. Using clean technique in L sidelying TDN performed to R iliacus with 2, 0.25 x 60 single needle placements with deep ache reported. Pistoning technique utilized.  Decreased tenderness reported to palpation following needling.   Pt educated throughout session about proper posture and technique with exercises. Improved exercise technique, movement at target joints, use of target muscles after min to mod verbal, visual, tactile cues.    Pt demonstrates excellent motivation throughout today's session. Manual therapy was continued including stretches, trigger point release, and belt assisted right hip mobilizations.  Continued with hip strengthening and patient denies any increase in her pain during session. HEP updated with patient. Discontinued seated hip flexor marches as this has been irritating at home and instead added supine eccentric right hip flexor straight knee lowering from 50-60 degrees to neutral. Performed trigger point dry needling with patient today to right iliacus and she reports decreased tenderness to palpation afterwards. Pt will benefit from skilled PT services to return to pain-free function at home, work, and with leisure activities such as running, hiking, and exercising.                               PT Short Term Goals - 09/20/21 1156       PT SHORT TERM GOAL #1   Title Pt will be independent with HEP to improve strength and decrease R hip pain in order to  improve pain-free function at home, work, and with exercise    Time 3    Period Weeks    Status New    Target Date 10/11/21               PT Long Term Goals - 09/20/21 1159       PT LONG TERM GOAL #1   Title Pt will report no further R hip pain with running, hiking, and working out with her personal trainer    Time 6    Period Weeks    Status New    Target Date 11/01/21      PT LONG TERM GOAL #2   Title Pt will increase FOTO to at least 90 in order to demonstrate significant improvement in function related to her R hip pain    Baseline  09/20/21: 72    Time 6    Period Weeks    Status New    Target Date 11/01/21      PT LONG TERM GOAL #3   Title Pt will increase pain free R hip abduction strength by at least 1/2 MMT grade in order to demonstrate improvement in strength and function.    Baseline 09/20/21: 4/5 and painful    Time 6    Period Weeks    Status New    Target Date 11/01/21      PT LONG TERM GOAL #4   Title Pt will increase LEFS by at least 9 points in order to demonstrate significant improvement in lower extremity function related to her R hip pain.    Baseline 09/20/21: 69/80    Time 6    Period Weeks    Status New    Target Date 11/01/21                   Plan - 10/04/21 1355     Clinical Impression Statement ?Pt demonstrates excellent motivation throughout today's session. Manual therapy was continued including stretches, trigger point release, and belt assisted right hip mobilizations.  Continued with hip strengthening and patient denies any increase in her pain during session. HEP updated with patient. Discontinued seated hip flexor marches as this has been irritating at home and instead added supine eccentric right hip flexor straight knee lowering from 50-60 degrees to neutral. Performed trigger point dry needling with patient today to right iliacus and she reports decreased tenderness to palpation afterwards. Pt will benefit from skilled PT  services to return to pain-free function at home, work, and with leisure activities such as running, hiking, and exercising.    Personal Factors and Comorbidities Comorbidity 1    Comorbidities Depression    Examination-Activity Limitations Squat    Examination-Participation Restrictions Community Activity;Other;Occupation   Exercise/running/hiking   Stability/Clinical Decision Making Stable/Uncomplicated    Rehab Potential Excellent    PT Frequency 1x / week    PT Duration 6 weeks    PT Treatment/Interventions ADLs/Self Care Home Management;Aquatic Therapy;Biofeedback;Canalith Repostioning;Cryotherapy;Electrical Stimulation;Iontophoresis 28m/ml Dexamethasone;Moist Heat;Traction;Ultrasound;Gait training;Stair training;Functional mobility training;Therapeutic activities;Therapeutic exercise;Balance training;Neuromuscular re-education;Patient/family education;Manual techniques;Passive range of motion;Dry needling;Vestibular;Spinal Manipulations;Joint Manipulations    PT Next Visit Plan Review HEP, progress R hip manual techniques and strengthening    PT Home Exercise Plan Access Code: [email or web address]    Consulted and Agree with Plan of Care Patient             Patient will benefit from skilled therapeutic intervention in order to improve the following deficits and impairments:  Pain, Decreased strength  Visit Diagnosis: Pain in right hip  Muscle weakness (generalized)     Problem List There are no problems to display for this patient.  JPhillips GroutPT, DPT, GCS  Mallory Simmons, PT 10/04/2021, 3:10 PM  [place] [facility] [place] [address]Phone: [phone]  Fax:  [phone] Name: Mallory PRYCEMRN: [id number] of Birth: [birth year]

## 2021-10-12 ENCOUNTER — Ambulatory Visit: Payer: BC Managed Care – PPO

## 2021-10-12 ENCOUNTER — Other Ambulatory Visit: Payer: Self-pay

## 2021-10-12 DIAGNOSIS — M25551 Pain in right hip: Secondary | ICD-10-CM

## 2021-10-12 DIAGNOSIS — M6281 Muscle weakness (generalized): Secondary | ICD-10-CM

## 2021-10-12 NOTE — Therapy (Signed)
[place] [facility] [address] [place], [place], [address] Phone: [phone]   Fax:  [phone]  Physical Therapy Treatment/Reassessment   Patient Details  Name: Mallory Simmons MRN: [id number] Date of Birth: 1975-10-30 Referring Provider (PT): Dr Sara Chu   Encounter Date: 10/12/2021   PT End of Session - 10/12/21 1519     Visit Number 4    Number of Visits 7    Date for PT Re-Evaluation 11/01/21    Authorization Type [place]  Ded[place]ible and OOP met- covered @ 100% until 10/23/21  no auth req    Authorization Time Period 09/20/21-11/01/21    Progress Note Due on Visit 10    PT Start Time 5038    PT Stop Time 1452    PT Time Calculation (min) 54 min    Activity Tolerance Patient tolerated treatment well;No increased pain    Behavior During Therapy WFL for tasks assessed/performed             Past Medical History:  Diagnosis Date   Depression    Thyroid disease     No past surgical history on file.  There were no vitals filed for this visit.   Subjective Assessment - 10/12/21 1401     Subjective Pt doing well today, reports she ran her 5k without issue regarding her hip injury. She is not having pain at all, nor with running. She feels her ROM and activity tolerance is improved overall. She is performing HEP without any questions and with perceived success. She plan to return to lower body person training soon.    Pertinent History During the night of 09/12/21 pt was getting up to go to the bathroom. She stepped with left leg to avoid her cat and she hyperextend her R hip. She had immediate pain in her R hip but no popping or locking. Immediately afterward she had difficulty putting weight on her RLE. She also had warmth in her right hip but no swelling or bruising. She denies any weakness in her right hip or numbness/tingling in RLE. She saw her PCP and used ibuprofen regularly all last  week. By Saturday her hip was feeling much better. Pt is very active and she likes to run and hike. She also works out with a Physiological scientist twice/week. During personal training she performs a lot of lunges, squats, and RDLs. She does not do plyometric exercises with the trainer. Pt works at [employer] at [employer]. She reports pain in R hip once she stands up if she sits for extended periods of time at work. No history of prior R hip injury or surgery. Denies back pain but reports diagnosis of mild scoliosis which was noted when she had her epidural during her son's birth. Review of systems is negative for red flags (bowel/bladder changes, saddle paresthesia, personal history of cancer, chills/fever, night sweats, unrelenting pain).    How long can you sit comfortably? No limitation only pain when rising from full depth squat (pain at final 10 degrees of movement.    How long can you walk comfortably? No limitation    Diagnostic tests No imaging    Patient Stated Goals "I want to know if I can do a 5K in December." Patient's main goal is return to regular exercise    Currently in Pain? No/denies                [facility] PT Assessment -  10/12/21 0001       Assessment   Medical Diagnosis Hip flexor strain    Referring Provider (PT) Dr Sara Chu    Onset Date/Surgical Date 09/12/21    Hand Dominance Right    Next MD Visit As needed    Prior Therapy None for this issue      Prior Function   Level of Independence Independent    Vocation Full time employment    Vocation Requirements Works as a Licensed conveyancer at [employer], running, exercising      Observation/Other Assessments   Focus on Therapeutic Outcomes (FOTO)  84   77 at eval     Functional Tests   Functional tests Squat;Single leg stance      Squat   Comments Education on self management of FAI with full depth squats, optimal foot/kee width      Single Leg Stance   Comments 30+sec bilat EO; <5sec bilat  EC   failure of hip/trunk righting strategy each time EC; EO Rt default to [place] posturing     Posture/Postural Control   Posture/Postural Control Postural limitations    Postural Limitations --   limited hip proprioception and righting bilat in SLS     ROM / Strength   AROM / PROM / Strength PROM;AROM;Strength      AROM   AROM Assessment Site Hip    Right/Left Hip Right;Left    Right Hip Extension 25   no rectus limitation   Left Hip Extension 25   no rectus limitation     PROM   PROM Assessment Site Hip    Right/Left Hip Right;Left    Right Hip Flexion 118   flexion in axillary line; 130 degrees at moderately wide angle   Right Hip External Rotation  35   prone testing   Right Hip Internal Rotation  65   Rt groin symptom pain at end range; prone testing   Left Hip Flexion 117   flexion in axillary line; 130 degrees at minimally  wide angle   Left Hip External Rotation  35   prone testing   Left Hip Internal Rotation  45   prone testing     Strength   Strength Assessment Site Hip;Knee;Ankle    Right/Left Hip Right;Left    Right Hip Flexion 4+/5   no longer painful, 4/5 at eval   Right Hip Extension 5/5   both straight and bent knee   Right Hip External Rotation  5/5    Right Hip Internal Rotation 4+/5   prone only (5/5 seated at 90)   Right Hip ABduction 5/5   tested in supine; horizontal ABDCT 5/5 painfree   Right Hip ADduction 5/5   horizontal adduction seated at 90   Left Hip Flexion 5/5   4+/5 at eval   Left Hip Extension 5/5   both straight and bent knee   Left Hip External Rotation 5/5    Left Hip Internal Rotation 4+/5   prone only (5/5 seated at 90)   Left Hip ABduction 5/5   tested in supine; horizontal ABDCT 5/5 painfree   Left Hip ADduction 5/5   horizontal adduction seated at 90   Right/Left Knee Left;Right    Right Knee Flexion 5/5    Left Knee Flexion 4+/5   similar seated and prone   Right/Left Ankle Right;Left    Right Ankle Dorsiflexion 5/5     Left Ankle Dorsiflexion 5/5  INTERVENTION THIS DATE: -eyes closed SLS 10x each side -SLS hip hike 1x10 bilat -SLS trunk/pelvis rotation (IR/ER) 10x bilat -deep squat to step stool x3        PT Education - 10/12/21 1519     Education Details HEP updated    Person(s) Educated Patient    Methods Explanation;Handout    Comprehension Verbalized understanding              PT Short Term Goals - 10/12/21 1524       PT SHORT TERM GOAL #1   Title Pt will be independent with HEP to improve strength and decrease R hip pain in order to improve pain-free function at home, work, and with exercise    Time 3    Period Weeks    Status Achieved    Target Date 10/11/21               PT Long Term Goals - 10/12/21 1524       PT LONG TERM GOAL #1   Title Pt will report no further R hip pain with running, hiking, and working out with her personal trainer    Time 6    Period Weeks    Status On-going    Target Date 11/01/21      PT LONG TERM GOAL #2   Title Pt will increase FOTO to at least 90 in order to demonstrate significant improvement in function related to her R hip pain    Baseline 09/20/21: 72; 10/12/21: 84    Time 6    Period Weeks    Status On-going    Target Date 11/01/21      PT LONG TERM GOAL #3   Title Pt will increase pain free R hip abduction strength by at least 1/2 MMT grade in order to demonstrate improvement in strength and function.    Baseline 09/20/21: 4/5 and painful;; 10/12/21: 5/5 pain free    Time 6    Period Weeks    Status Achieved    Target Date 11/01/21      PT LONG TERM GOAL #4   Title Pt will increase LEFS by at least 9 points in order to demonstrate significant improvement in lower extremity function related to her R hip pain.    Baseline 09/20/21: 69/80    Time 6    Period Weeks    Status Unable to assess    Target Date 11/01/21                   Plan - 10/12/21 1520     Clinical Impression Statement  Pt reports she is feeling ready for DC. She no longer has pain, was able to meet goal of running a 5k race last week. She is asks for guidance regarding return to baseline lower body personal training. Reassessment today demonstrate improvements in strength testing. Pt only has pain with end range hip IR in prone adn femoroacetabula rimpingement durign flexion ROM testing. Propriceptive screening reveals limtations in both hips, education on corrective performance at home. Pt educated on FAI safe posturing for return to gym. Pt is agreeable to making today a tentative last visit, would like to keep an appointment scheduled 4 weeks out in anticipation of any FU needs once she returns to lower body conditioning. FOTO score shows improvement .    Personal Factors and Comorbidities Comorbidity 1    Comorbidities Depression    Examination-Activity Limitations Squat    Examination-Participation Restrictions Community Activity;Other;Occupation  Stability/Clinical Decision Making Stable/Uncomplicated    Clinical Decision Making Low    Rehab Potential Excellent    PT Frequency 1x / week    PT Duration 6 weeks    PT Treatment/Interventions ADLs/Self Care Home Management;Aquatic Therapy;Biofeedback;Canalith Repostioning;Cryotherapy;Electrical Stimulation;Iontophoresis 28m/ml Dexamethasone;Moist Heat;Traction;Ultrasound;Gait training;Stair training;Functional mobility training;Therapeutic activities;Therapeutic exercise;Balance training;Neuromuscular re-education;Patient/family education;Manual techniques;Passive range of motion;Dry needling;Vestibular;Spinal Manipulations;Joint Manipulations    PT Next Visit Plan Review HEP, progress R hip manual techniques and strengthening    PT Home Exercise Plan Access Code: [email or web address]    Consulted and Agree with Plan of Care Patient             Patient will benefit from skilled therapeutic intervention in order to improve the following deficits and impairments:  Pain,  Decreased strength  Visit Diagnosis: Pain in right hip  Muscle weakness (generalized)     Problem List There are no problems to display for this patient.  3:44 PM, 10/12/21 AEtta Grandchild PT, DPT Physical Therapist - [facility] [place]([facility]     [place]12/20/2022, 3:26 PM  [place] [facility] [address]Phone: [phone]  Fax:  [phone] Name: Mallory DONAHOOMRN: [id number] of Birth: 112/03/1976

## 2021-10-19 NOTE — Addendum Note (Signed)
Addended by: Roxana Hires D on: 10/19/2021 09:57 AM   Modules accepted: Orders

## 2022-02-09 DIAGNOSIS — I10 Essential (primary) hypertension: Secondary | ICD-10-CM | POA: Insufficient documentation

## 2022-03-30 ENCOUNTER — Ambulatory Visit: Payer: BC Managed Care – PPO | Attending: Sports Medicine

## 2022-03-30 DIAGNOSIS — M25521 Pain in right elbow: Secondary | ICD-10-CM | POA: Insufficient documentation

## 2022-03-30 NOTE — Therapy (Signed)
OUTPATIENT PHYSICAL THERAPY ELBOW EVALUATION   Patient Name: Mallory Simmons MRN: [id number] DOB:May 11, 1976, 46 y.o., female Today's Date: 03/31/2022   PT End of Session - 03/31/22 1631     Visit Number 1    Number of Visits 13    Date for PT Re-Evaluation 05/11/22    Authorization Type 03/30/22: [place] [employer]: [id number] on [place]  Deductible:MET  Co ins:20%  OOP:$[id number]/$3255.85 remains    PT Start Time 0802    PT Stop Time 0845    PT Time Calculation (min) 43 min    Activity Tolerance Patient tolerated treatment well    Behavior During Therapy WFL for tasks assessed/performed              Past Medical History:  Diagnosis Date   Depression    Thyroid disease    History reviewed. No pertinent surgical history. There are no problems to display for this patient.   PCP: Gayland Curry, MD  REFERRING PROVIDER: Gayland Curry, MD  REFERRING DIAGNOSIS: Medial epicondylitis of right elbow (M77.01)  THERAPY DIAG: Pain in right elbow  RATIONALE FOR EVALUATION AND TREATMENT: Rehabilitation  ONSET DATE: 01/22/22 (approximate)    SUBJECTIVE:                                                                                                                                                                                         Chief Complaint: R elbow pain  Pertinent History R medial elbow pain which started early April 2023 while lifting free weights. It was particularly aggravated when performing bicep curls. She was also reaching her heaviest weight with her dumbbell bench press and this motion would aggravate her pain as well. Around that same time she was on a ropes course with her son and in order to help him she had to hold the rope with maximal R elbow and wrist flexion which caused acute pain. The patient has tried ibuprofen, relative rest and an arm sleeve with minimal benefit. She saw orthopedics who recommended a tennis elbow brace to be warn on the medial side of  the arm. She has not tried this yet because she is unclear exactly where to put the brace. She was also referred for physical therapy and advised that she can use topical Voltaren which she has not tried yet. 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   Pain:  Pain Intensity: Present: 0/10, Best: 0/10; Pain location: Medial right elbow at the medial epicondyle extending down the medial forearm; Pain Quality: sharp and achy  Radiating: Yes from the elbow down the  medial R forearm Numbness/Tingling: No Focal Weakness: No Aggravating factors: using mouse at work, weight lifting, sometimes hurts at night, hurts more with excessive use of her right thumb Relieving factors: ice, no change with ibuprofen/tylenol 24-hour pain behavior: worse in the AM History of prior neck, shoulder, or neck/shoulder injury, pain, surgery, or therapy: No Dominant hand: right Imaging: Yes  Prior level of function: Independent Occupational demands: Licensed conveyancer at [employer]: mountain biking, running, spending time with son Red flags (personal history of cancer, chills/fever, night sweats, nausea, vomiting, unrelenting pain): Negative  Precautions: None  Weight Bearing Restrictions: No  Patient Goals: Return to weightlifting without pain, work without pain    OBJECTIVE:   Patient Surveys  FOTO: 22, predicted improvement to 77 QuickDASH: 15.9%  Cognition Patient is oriented to person, place, and time.  Recent memory is intact.  Remote memory is intact.  Attention span and concentration are intact.  Expressive speech is intact.  Patient's fund of knowledge is within normal limits for educational level.    Gross Musculoskeletal Assessment Tremor: None Bulk: Normal Tone: Normal  Gait Deferred  Posture Mild forward head but otherwise WNL, full assessment deferred  Cervical Screen AROM: WFL and painless with overpressure in all planes Spurlings A  (ipsilateral lateral flexion/axial compression): R: Negative L: Negative Spurlings B (ipsilateral lateral flexion/contralateral rotation/axial compression): R: Negative L: Negative Repeated movement: No centralization or peripheralization with protraction or retraction Hoffman Sign (cervical cord compression): R: Not examined L: Not examined ULTT Median: R: Not examined L: Not examined ULTT Ulnar: R: Not examined L: Not examined ULTT Radial: R: Not examined L: Not examined   AROM  AROM (Normal range in degrees) AROM 03/31/2022  Cervical  Flexion (50) WNL  Extension (80) WNL  Right lateral flexion (45) WNL  Left lateral flexion (45) WNL  Right rotation (85) WNL  Left rotation (85) WNL   Right Left  Shoulder    Flexion WNL WNL  Extension    Abduction WNL WNL  External Rotation    Internal Rotation    Hands Behind Head    Hands Behind Back        Elbow    Flexion WNL WNL  Extension WNL WNL  Pronation WNL WNL  Supination WNL WNL      Wrist    Flexion WNL* WNL  Extension WNL* WNL  Radial Deviation WNL WNL  Ulnar Deviation WNL WNL  (* = pain; Blank rows = not tested)    LE MMT:  MMT (out of 5) Right 03/31/2022 Left 03/31/2022  Cervical (isometric)  Flexion   Extension   Lateral Flexion    Rotation        Shoulder   Flexion 5 5  Extension    Abduction 5 5  External rotation 5 5  Internal rotation 5 5  Horizontal abduction    Horizontal adduction    Lower Trapezius    Rhomboids        Elbow  Flexion 5* 5  Extension 5 5  Pronation 5* 5  Supination 5 5      Wrist  Flexion 5* 5  Extension 5 5  Radial deviation 5* 5  Ulnar deviation 5 5      MCP  Flexion 5* 5  Extension 5* 5  Abduction 5 5  Adduction 5 5  (* = pain; Blank rows = not tested)  Grip strength L: 46, 44.3, 48.7 (46.3#) R: 44.4, 47.8, 50.2 (47.5#), maximal grip testing is painful  R  grip pain-free threshold: 36#  Sensation Grossly intact to light touch bilateral UE as determined by  testing dermatomes C2-T2. Proprioception and hot/cold testing deferred on this date.   Reflexes Deferred   Palpation Pt reports pain to palpation over R medial epicondyle over common wrist flexor tendon insertion. Pain to palpation distally along common wrist flexor mass as well. No pain on lateral aspect of R elbow or over lateral epicondyle. No pain distally at wrist;  Repeated Movements Deferred   Passive Accessory Intervertebral Motion Deferred  Muscle Length Testing Deferred  Beighton scale:  Deferred   TODAY'S TREATMENT  HEP issued   PATIENT EDUCATION:  Education details: Plan of care and HEP Person educated: Patient Education method: Explanation, verbal cues, and handout Education comprehension: verbalized understanding and returned demonstration   HOME EXERCISE PROGRAM: Access Code: [email or web address] URL: [email or web address] Date: 03/31/2022 Prepared by: Roxana Hires  Exercises - Seated Eccentric Wrist Flexion with Dumbbell  - 2 x daily - 7 x weekly - 2 sets - 10 reps - 5-6s to lower hold - Wrist Flexor Stretch with Elbow Flexed: Progression From Elbow at Side  - 2 x daily - 7 x weekly - 2-3 reps - 30-45s hold  Patient Education - Golfer's Elbow - Ice Massage   ASSESSMENT:  CLINICAL IMPRESSION: Patient is a 46 y.o. female who was seen today for physical therapy evaluation and treatment for R elbow pain. Objective impairments include impaired UE functional use and pain. Examination findings today are consistent with R medial epicondylitis/epicondylosis. These impairments are limiting patient from her occupation and exercising . Personal factors including Time since onset of injury/illness/exacerbation and 1 comorbidity: hx of depression  are also affecting patient's functional outcome. Patient will benefit from skilled PT to address above impairments and improve overall function.  REHAB POTENTIAL: Excellent  CLINICAL DECISION MAKING:  Stable/uncomplicated  EVALUATION COMPLEXITY: Low   GOALS: Goals reviewed with patient? Yes  SHORT TERM GOALS: Target date: 04/20/22  Pt will be independent with HEP to improve strength and decrease elbow pain to improve pain-free function at home, work, and with exercise Baseline:  Goal status: INITIAL   LONG TERM GOALS: Target date: 05/11/22   Pt will increase FOTO to at least 77 to demonstrate significant improvement in function at home and work related to neck pain  Baseline: 03/30/22: 69 Goal status: INITIAL  2.  Pt will return to full pain-free R grip strength (47.5#) in order to improve her ability to exercise and work without an increase in her pain Baseline: 03/30/22: R grip pain-free threshold: 36# Goal status: INITIAL  3.  Pt will decrease quick DASH score by at least 8% in order to demonstrate clinically significant reduction in disability related to shoulder pain        Baseline: 03/30/22: 15.9% Goal status: INITIAL    PLAN: PT FREQUENCY: 1-2x/week  PT DURATION: 6 weeks  PLANNED INTERVENTIONS: Therapeutic exercises, Therapeutic activity, Neuromuscular re-education, Balance training, Gait training, Patient/Family education, Joint manipulation, Joint mobilization, Vestibular training, Canalith repositioning, Aquatic Therapy, Dry Needling, Electrical stimulation, Spinal manipulation, Spinal mobilization, Cryotherapy, Moist heat, Traction, Ultrasound, Ionotophoresis 44m/ml Dexamethasone, and Manual therapy  PLAN FOR NEXT SESSION: Review and modify HEP as needed (regress to isometrics or continue eccentrics at higher load), STM/IASTM to common wrist flexor mass, stretches, consider TDN if necessary,   JLyndel SafeHuprich PT, DPT, [facility]  Brantly Kalman 03/31/2022, 4:31 PM

## 2022-04-04 ENCOUNTER — Ambulatory Visit: Payer: BC Managed Care – PPO

## 2022-04-04 DIAGNOSIS — M25521 Pain in right elbow: Secondary | ICD-10-CM

## 2022-04-04 NOTE — Therapy (Signed)
OUTPATIENT PHYSICAL THERAPY ELBOW TREATMENT NOTE   Patient Name: Mallory Simmons MRN: [id number] DOB:08/27/1976, 46 y.o., female Today's Date: 04/04/2022   PT End of Session - 04/04/22 0835     Visit Number 2    Number of Visits 13    PT Start Time 0845    PT Stop Time 0930    PT Time Calculation (min) 45 min              Past Medical History:  Diagnosis Date   Depression    Thyroid disease    No past surgical history on file. There are no problems to display for this patient.   PCP: Gayland Curry, MD  REFERRING PROVIDER: Elsie Amis*  REFERRING DIAGNOSIS: Medial epicondylitis of right elbow (M77.01)  THERAPY DIAG: Pain in right elbow  RATIONALE FOR EVALUATION AND TREATMENT: Rehabilitation  ONSET DATE: 01/22/22 (approximate)        SUBJECTIVE:                                                                                                                                                                                        04/04/22: Pt reports she has been working on her HEP with 2# weights; she is not having any increase in soreness during or after the exercises.  She has a few questions about when to wear the tennis elbow brace on the medial side of forearm.  She has continued to modify her gym strength training workouts.  Movements that require her elbow to repeatedly bend are painful (example: push/pulling a vacuum).    Pertinent History R medial elbow pain which started early April 2023 while lifting free weights. It was particularly aggravated when performing bicep curls. She was also reaching her heaviest weight with her dumbbell bench press and this motion would aggravate her pain as well. Around that same time she was on a ropes course with her son and in order to help him she had to hold the rope with maximal R elbow and wrist flexion which caused acute pain. The patient has tried ibuprofen, relative rest and an arm sleeve with minimal  benefit. She saw orthopedics who recommended a tennis elbow brace to be warn on the medial side of the arm. She has not tried this yet because she is unclear exactly where to put the brace. She was also referred for physical therapy and advised that she can use topical Voltaren which she has not tried yet. 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   Pain:  Pain Intensity: Present: 0/10, Best: 0/10; Pain location:  Medial right elbow at the medial epicondyle extending down the medial forearm; Pain Quality: sharp and achy  Radiating: Yes from the elbow down the medial R forearm Numbness/Tingling: No Focal Weakness: No Aggravating factors: using mouse at work, weight lifting, sometimes hurts at night, hurts more with excessive use of her right thumb Relieving factors: ice, no change with ibuprofen/tylenol 24-hour pain behavior: worse in the AM History of prior neck, shoulder, or neck/shoulder injury, pain, surgery, or therapy: No Dominant hand: right Imaging: Yes  Prior level of function: Independent 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  Recent memory is intact.  Remote memory is intact.  Attention span and concentration are intact.  Expressive speech is intact.  Patient's fund of knowledge is within normal limits for educational level.    Gross Musculoskeletal Assessment Tremor: None Bulk: Normal Tone: Normal  Gait Deferred  Posture Mild forward head but otherwise WNL, full  assessment deferred  Cervical Screen AROM: WFL and painless with overpressure in all planes Spurlings A (ipsilateral lateral flexion/axial compression): R: Negative L: Negative Spurlings B (ipsilateral lateral flexion/contralateral rotation/axial compression): R: Negative L: Negative Repeated movement: No centralization or peripheralization with protraction or retraction Hoffman Sign (cervical cord compression): R: Not examined L: Not examined ULTT Median: R: Not examined L: Not examined ULTT Ulnar: R: Not examined L: Not examined ULTT Radial: R: Not examined L: Not examined   AROM  AROM (Normal range in degrees) AROM 04/04/2022  Cervical  Flexion (50) WNL  Extension (80) WNL  Right lateral flexion (45) WNL  Left lateral flexion (45) WNL  Right rotation (85) WNL  Left rotation (85) WNL   Right Left  Shoulder    Flexion WNL WNL  Extension    Abduction WNL WNL  External Rotation    Internal Rotation    Hands Behind Head    Hands Behind Back        Elbow    Flexion WNL WNL  Extension WNL WNL  Pronation WNL WNL  Supination WNL WNL      Wrist    Flexion WNL* WNL  Extension WNL* WNL  Radial Deviation WNL WNL  Ulnar Deviation WNL WNL  (* = pain; Blank rows = not tested)    LE MMT:  MMT (out of 5) Right 04/04/2022 Left 04/04/2022  Cervical (isometric)  Flexion   Extension   Lateral Flexion    Rotation        Shoulder   Flexion 5 5  Extension    Abduction 5 5  External rotation 5 5  Internal rotation 5 5  Horizontal abduction    Horizontal adduction    Lower Trapezius    Rhomboids        Elbow  Flexion 5* 5  Extension 5 5  Pronation 5* 5  Supination 5 5      Wrist  Flexion 5* 5  Extension 5 5  Radial deviation 5* 5  Ulnar deviation 5 5      MCP  Flexion 5* 5  Extension 5* 5  Abduction 5 5  Adduction 5 5  (* = pain;  Blank rows = not tested)  Grip strength L: 46, 44.3, 48.7 (46.3#) R: 44.4, 47.8, 50.2 (47.5#), maximal grip testing is  painful  R grip pain-free threshold: 36#  Sensation Grossly intact to light touch bilateral UE as determined by testing dermatomes C2-T2. Proprioception and hot/cold testing deferred on this date.   Reflexes Deferred   Palpation Pt reports pain to palpation over R medial epicondyle over common wrist flexor tendon insertion. Pain to palpation distally along common wrist flexor mass as well. No pain on lateral aspect of R elbow or over lateral epicondyle. No pain distally at wrist;  Repeated Movements Deferred   Passive Accessory Intervertebral Motion Deferred  Muscle Length Testing Deferred  Beighton scale:  Deferred   TODAY'S TREATMENT  04/04/22: Medial Elbow Pain: 0/10 at rest, 4/26 with certain movements  Rechecked sx's with elbow/wrist AROM; pt reports typical pain at R end range elbow flexion and extension AROM, TTP along medial common flexor proximal attachment  Manual Therapy: STM R forearm mm: pronator teres, FCU, FCR, FDS Manual wrist extensor stretch: 3x 20 sec PAM: Gr 1/2 Elbow humeroulnar lat/med glide for pain control 20 sec x 3  Therapeutic Exercise:  Wrist+ elbow extension stretch: 3 x 15 sec Eccentric wrist flexion: -2#: 2x10 (pt notes 0/10 pain during or after) -3#: 1x10 (pt notes 0/10 pain during or after) -with green t-band anchored under foot: x10 attempt, but was too difficult and increased pain to >4/10 so discontinued today  Pt ed for HEP: discussed progressing HEP with increased reps with 2# (2x15), pt may get a 3# weight for home too so discussed how to progress with 3# (2x10) and monitor for soreness ("ok if soreness level stays <4/10); continue with wrist flexor mm stretching; also reviewed that using brace is primarily helpful when she is physically using her UE during the day.  Reviewed ice massage for home too for pain control.   PATIENT EDUCATION:  Education details: Plan of care and HEP Person educated: Patient Education method:  Explanation, verbal cues, and handout Education comprehension: verbalized understanding and returned demonstration   HOME EXERCISE PROGRAM: Access Code: [email or web address] URL: [email or web address] Date: 03/31/2022 Prepared by: Roxana Hires  Exercises - Seated Eccentric Wrist Flexion with Dumbbell  - 2 x daily - 7 x weekly - 2 sets - 10 reps - 5-6s to lower hold - Wrist Flexor Stretch with Elbow Flexed: Progression From Elbow at Side  - 2 x daily - 7 x weekly - 2-3 reps - 30-45s hold  Patient Education - Golfer's Elbow - Ice Massage   ASSESSMENT: (04/04/22) Pt tolerated treatment well today.  Based on her report she tolerated initiation of eccentric wrist flexor retraining well at last visit.  Able to progress with eccentric exercises today and initiated manual therapy techniques to promote pain reduction and improved muscle function.  She should continue to benefit from skilled PT per current POC.   REHAB POTENTIAL: Excellent  CLINICAL DECISION MAKING: Stable/uncomplicated  EVALUATION COMPLEXITY: Low   GOALS: Goals reviewed with patient? Yes  SHORT TERM GOALS: Target date: 04/20/22  Pt will be independent with HEP to improve strength and decrease elbow pain to improve pain-free function at home, work, and with exercise Baseline:  Goal status: INITIAL   LONG TERM GOALS: Target date: 05/11/22   Pt will increase FOTO to at least 77 to demonstrate significant improvement in function at home and work related to neck pain  Baseline: 03/30/22: 69 Goal status: INITIAL  2.  Pt will return  to full pain-free R grip strength (47.5#) in order to improve her ability to exercise and work without an increase in her pain Baseline: 03/30/22: R grip pain-free threshold: 36# Goal status: INITIAL  3.  Pt will decrease quick DASH score by at least 8% in order to demonstrate clinically significant reduction in disability related to shoulder pain        Baseline: 03/30/22: 15.9% Goal status:  INITIAL    PLAN: PT FREQUENCY: 1-2x/week  PT DURATION: 6 weeks  PLANNED INTERVENTIONS: Therapeutic exercises, Therapeutic activity, Neuromuscular re-education, Balance training, Gait training, Patient/Family education, Joint manipulation, Joint mobilization, Vestibular training, Canalith repositioning, Aquatic Therapy, Dry Needling, Electrical stimulation, Spinal manipulation, Spinal mobilization, Cryotherapy, Moist heat, Traction, Ultrasound, Ionotophoresis '4mg'$ /ml Dexamethasone, and Manual therapy  PLAN FOR NEXT SESSION: Review and modify HEP as needed (regress to isometrics or continue eccentrics at higher load), STM/IASTM to common wrist flexor mass, stretches, consider TDN if necessary,     Pincus Badder 04/04/2022, 12:12 PM   [facility] [address] [place], [place], [address] Phone: [phone]   Fax:  [phone]  Patient Details  Name: Mallory Simmons MRN: [id number] Date of Birth: 1976/04/24 Referring Provider:  Elsie Amis*  Encounter Date: 04/04/2022   Pincus Badder, PT 04/04/2022, 12:12 PM Merdis Delay, PT, DPT  [phone]   [facility] [address] [place], [place], [address] Phone: [phone]   Fax:  [phone]

## 2022-04-06 DIAGNOSIS — M2012 Hallux valgus (acquired), left foot: Secondary | ICD-10-CM | POA: Insufficient documentation

## 2022-04-06 DIAGNOSIS — M79672 Pain in left foot: Secondary | ICD-10-CM | POA: Insufficient documentation

## 2022-04-11 ENCOUNTER — Ambulatory Visit: Payer: BC Managed Care – PPO

## 2022-04-11 DIAGNOSIS — M25521 Pain in right elbow: Secondary | ICD-10-CM

## 2022-04-11 NOTE — Therapy (Signed)
OUTPATIENT PHYSICAL THERAPY ELBOW TREATMENT   Patient Name: Mallory Simmons MRN: [id number] DOB:[birth year], 46 y.o., female Today's Date: 04/11/2022   PT End of Session - 04/11/22 1123     Visit Number 3    Number of Visits 13    Date for PT Re-Evaluation 05/11/22    Authorization Type 03/30/22: [facility]: [id number]  Deductible:MET  Co ins:20%  OOP:$[id number]/$3255.85 remains    PT Start Time 0850    PT Stop Time 0932    PT Time Calculation (min) 42 min    Activity Tolerance Patient tolerated treatment well    Behavior During Therapy WFL for tasks assessed/performed             Past Medical History:  Diagnosis Date   Depression    Thyroid disease    History reviewed. No pertinent surgical history. There are no problems to display for this patient.   PCP: Gayland Curry, MD  REFERRING PROVIDER: Gayland Curry, MD  REFERRING DIAGNOSIS: Medial epicondylitis of right elbow (M77.01)  THERAPY DIAG: Pain in right elbow  RATIONALE FOR EVALUATION AND TREATMENT: Rehabilitation  ONSET DATE: 01/22/22 (approximate)  From Initial Evaluation  SUBJECTIVE:                                                                                                                                                                                         Chief Complaint: R elbow pain  Pertinent History R medial elbow pain which started early April 2023 while lifting free weights. It was particularly aggravated when performing bicep curls. She was also reaching her heaviest weight with her dumbbell bench press and this motion would aggravate her pain as well. Around that same time she was on a ropes course with her son and in order to help him she had to hold the rope with maximal R elbow and wrist flexion which caused acute pain. The patient has tried ibuprofen, relative rest and an arm sleeve with minimal benefit. She saw orthopedics who recommended a tennis elbow brace to be warn on  the medial side of the arm. She has not tried this yet because she is unclear exactly where to put the brace. She was also referred for physical therapy and advised that she can use topical Voltaren which she has not tried yet. 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   Pain:  Pain Intensity: Present: 0/10, Best: 0/10; Pain location: Medial right elbow at the medial epicondyle extending down the medial forearm; Pain Quality: sharp and achy  Radiating: Yes from the elbow down  the medial R forearm Numbness/Tingling: No Focal Weakness: No Aggravating factors: using mouse at work, weight lifting, sometimes hurts at night, hurts more with excessive use of her right thumb Relieving factors: ice, no change with ibuprofen/tylenol 24-hour pain behavior: worse in the AM History of prior neck, shoulder, or neck/shoulder injury, pain, surgery, or therapy: No Dominant hand: right Imaging: Yes  Prior level of function: Independent 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  Recent memory is intact.  Remote memory is intact.  Attention span and concentration are intact.  Expressive speech is intact.  Patient's fund of knowledge is within normal limits for educational level.    Gross Musculoskeletal Assessment Tremor: None Bulk: Normal Tone: Normal  Gait Deferred  Posture Mild forward head but otherwise WNL, full assessment deferred  Cervical Screen AROM: WFL and painless with overpressure in all  planes Spurlings A (ipsilateral lateral flexion/axial compression): R: Negative L: Negative Spurlings B (ipsilateral lateral flexion/contralateral rotation/axial compression): R: Negative L: Negative Repeated movement: Deferred Hoffman Sign (cervical cord compression): R: Not examined L: Not examined ULTT Median: R: Not examined L: Not examined ULTT Ulnar: R: Not examined L: Not examined ULTT Radial: R: Not examined L: Not examined   AROM  AROM (Normal range in degrees) AROM 04/11/2022  Cervical  Flexion (50) WNL  Extension (80) WNL  Right lateral flexion (45) WNL  Left lateral flexion (45) WNL  Right rotation (85) WNL  Left rotation (85) WNL   Right Left  Shoulder    Flexion WNL WNL  Extension    Abduction WNL WNL  External Rotation    Internal Rotation    Hands Behind Head    Hands Behind Back        Elbow    Flexion WNL WNL  Extension WNL WNL  Pronation WNL WNL  Supination WNL WNL      Wrist    Flexion WNL* WNL  Extension WNL* WNL  Radial Deviation WNL WNL  Ulnar Deviation WNL WNL  (* = pain; Blank rows = not tested)    LE MMT:  MMT (out of 5) Right 04/11/2022 Left 04/11/2022  Cervical (isometric)  Flexion   Extension   Lateral Flexion    Rotation        Shoulder   Flexion 5 5  Extension    Abduction 5 5  External rotation 5 5  Internal rotation 5 5  Horizontal abduction    Horizontal adduction    Lower Trapezius    Rhomboids        Elbow  Flexion 5* 5  Extension 5 5  Pronation 5* 5  Supination 5 5      Wrist  Flexion 5* 5  Extension 5 5  Radial deviation 5* 5  Ulnar deviation 5 5      MCP  Flexion 5* 5  Extension 5* 5  Abduction 5 5  Adduction 5 5  (* = pain; Blank rows = not tested)  Grip strength L: 46, 44.3, 48.7 (46.3#) R: 44.4, 47.8, 50.2 (47.5#), maximal grip testing is painful  R grip pain-free threshold: 36#  Sensation  Grossly intact to light touch bilateral UE as determined by testing dermatomes C2-T2.  Proprioception and hot/cold testing deferred on this date.   Reflexes Deferred   Palpation Pt reports pain to palpation over R medial epicondyle over common wrist flexor tendon insertion. Pain to palpation distally along common wrist flexor mass as well. No pain on lateral aspect of R elbow or over lateral epicondyle. No pain distally at wrist;  Repeated Movements Deferred   Passive Accessory Intervertebral Motion Deferred  Muscle Length Testing Deferred  Beighton scale:  Deferred   TODAY'S TREATMENT   SUBJECTIVE: Pt reports that she is doing well today. She feels like she is making some progress and has noticed improvement in her R medial elbow pain. She has been able to progress her repetitions with exercises at home without any increase in pain. She has been using her tennis elbow brace on the medial forearm occasionally with benefit.  PAIN: Medial Elbow Pain: 0/10 at rest;  Rechecked sx's with elbow/wrist AROM; pt remains TTP along medial common flexor proximal attachment but less    Manual Therapy  IASTM R forearm mm: pronator teres, FCU, FCR, FDS Manual wrist extensor stretch x 30s following TDN; R ulnar distraction with elbow flexed to 90 degrees, grade II-III, 20s/bout x 2 bouts;   Ther-ex  Straight R elbow wrist extension stretch 3 x 30s; Eccentric wrist flexion with dumbbells: 3# x 10 (pt notes 0/10 pain during or after) 4# x 10 (pt notes 0/10 pain during or after) 5# x 10 (pt notes 0/10 pain during or after) Hammer eccentric pronator strengthening x 10; Hammer eccentric radial deviation strengthening x 10;   Trigger Point Dry Needling (TDN), unbilled Education performed with patient regarding potential benefit of TDN. Pt provided verbal consent to treatment. TDN performed to R wrist flexor muscle mass with threading technique\ with 1, 0.25 x 40 single needle placements with deep ache reported. Pistoning technique utilized. Stretching performing  afterwards.   PATIENT EDUCATION:  Education details: Plan of care and HEP Person educated: Patient Education method: Explanation, verbal cues, and handout Education comprehension: verbalized understanding and returned demonstration   HOME EXERCISE PROGRAM: Access Code: [email or web address] URL: [email or web address] Date: 04/11/2022 Prepared by: Roxana Hires  Exercises - Seated Eccentric Wrist Flexion with Dumbbell  - 2 x daily - 7 x weekly - 2 sets - 10 reps - 5-6s to lower hold - Forearm Pronation and Supination with Hammer  - 2 x daily - 7 x weekly - 2 sets - 10 reps - 5-6s hold - Seated Wrist Radial Deviation with Dumbbell  - 2 x daily - 7 x weekly - 2 sets - 10 reps - 5-6s hold  Patient Education - Golfer's Elbow - Ice Massage   ASSESSMENT:  CLINICAL IMPRESSION: Pt denies increase in her pain during therapy session today. She is able to progress resistance with eccentric wrist flexor exercises and therapist introduced eccentric pronator and radial deviator strengthening. Utilized IASTM to forearm flexors. HEP progressed with patient during visit. Pt will benefit from PT services to address deficits in strength and pain in order to return to full function at home, work, and with exercise.   REHAB POTENTIAL: Excellent  CLINICAL DECISION MAKING: Stable/uncomplicated  EVALUATION COMPLEXITY: Low   GOALS: Goals reviewed with patient? Yes  SHORT TERM GOALS: Target date: 04/20/22  Pt will be independent with HEP to improve strength and decrease elbow pain to improve pain-free function at home, work, and with exercise Baseline:  Goal status: INITIAL  LONG TERM GOALS: Target date: 05/11/22   Pt will increase FOTO to at least 77 to demonstrate significant improvement in function at home and work related to neck pain  Baseline: 03/30/22: 69 Goal status: INITIAL  2.  Pt will return to full pain-free R grip strength (47.5#) in order to improve her ability to exercise and  work without an increase in her pain Baseline: 03/30/22: R grip pain-free threshold: 36# Goal status: INITIAL  3.  Pt will decrease quick DASH score by at least 8% in order to demonstrate clinically significant reduction in disability related to shoulder pain        Baseline: 03/30/22: 15.9% Goal status: INITIAL    PLAN: PT FREQUENCY: 1-2x/week  PT DURATION: 6 weeks  PLANNED INTERVENTIONS: Therapeutic exercises, Therapeutic activity, Neuromuscular re-education, Balance training, Gait training, Patient/Family education, Joint manipulation, Joint mobilization, Vestibular training, Canalith repositioning, Aquatic Therapy, Dry Needling, Electrical stimulation, Spinal manipulation, Spinal mobilization, Cryotherapy, Moist heat, Traction, Ultrasound, Ionotophoresis 31m/ml Dexamethasone, and Manual therapy  PLAN FOR NEXT SESSION: Review and modify HEP as needed (continue eccentrics at higher load and progress to concentric strengthening), STM/IASTM to common wrist flexor mass, stretches, assess response to TDN  Dianelly Ferran D Krissy Orebaugh PT, DPT, GCS  Grantley Savage 04/11/2022, 12:58 PM

## 2022-04-16 NOTE — Therapy (Signed)
OUTPATIENT PHYSICAL THERAPY ELBOW TREATMENT   Patient Name: Mallory Simmons MRN: [id number] DOB:11/02/1975, 46 y.o., female Today's Date: 04/18/2022   PT End of Session - 04/18/22 1154     Visit Number 4    Number of Visits 13    Date for PT Re-Evaluation 05/11/22    Authorization Type 03/30/22: [employer] [facility]: [id number]  Deductible:MET  Co ins:20%  OOP:$[id number]/$[id number] remains    PT Start Time 1150    PT Stop Time 1230    PT Time Calculation (min) 40 min    Activity Tolerance Patient tolerated treatment well    Behavior During Therapy WFL for tasks assessed/performed              Past Medical History:  Diagnosis Date   Depression    Thyroid disease    History reviewed. No pertinent surgical history. There are no problems to display for this patient.   PCP: Leim Fabry, MD  REFERRING PROVIDER: Leim Fabry, MD  REFERRING DIAGNOSIS: Medial epicondylitis of right elbow (M77.01)  THERAPY DIAG: Pain in right elbow  RATIONALE FOR EVALUATION AND TREATMENT: Rehabilitation  ONSET DATE: 01/22/22 (approximate)  From Initial Evaluation  SUBJECTIVE:                                                                                                                                                                                         Chief Complaint: R elbow pain  Pertinent History R medial elbow pain which started early April 2023 while lifting free weights. It was particularly aggravated when performing bicep curls. She was also reaching her heaviest weight with her dumbbell bench press and this motion would aggravate her pain as well. Around that same time she was on a ropes course with her son and in order to help him she had to hold the rope with maximal R elbow and wrist flexion which caused acute pain. The patient has tried ibuprofen, relative rest and an arm sleeve with minimal benefit. She saw orthopedics who recommended a tennis elbow brace to be warn  on the medial side of the arm. She has not tried this yet because she is unclear exactly where to put the brace. She was also referred for physical therapy and advised that she can use topical Voltaren which she has not tried yet. 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   Pain:  Pain Intensity: Present: 0/10, Best: 0/10; Pain location: Medial right elbow at the medial epicondyle extending down the medial forearm; Pain Quality: sharp and achy  Radiating: Yes from the elbow  down the medial R forearm Numbness/Tingling: No Focal Weakness: No Aggravating factors: using mouse at work, weight lifting, sometimes hurts at night, hurts more with excessive use of her right thumb Relieving factors: ice, no change with ibuprofen/tylenol 24-hour pain behavior: worse in the AM History of prior neck, shoulder, or neck/shoulder injury, pain, surgery, or therapy: No Dominant hand: right Imaging: Yes  Prior level of function: Independent 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  Recent memory is intact.  Remote memory is intact.  Attention span and concentration are intact.  Expressive speech is intact.  Patient's fund of knowledge is within normal limits for educational level.    Gross Musculoskeletal Assessment Tremor: None Bulk: Normal Tone: Normal  Gait Deferred  Posture Mild forward head but otherwise WNL, full assessment deferred  Cervical Screen AROM: WFL and painless with overpressure in  all planes Spurlings A (ipsilateral lateral flexion/axial compression): R: Negative L: Negative Spurlings B (ipsilateral lateral flexion/contralateral rotation/axial compression): R: Negative L: Negative Repeated movement: Deferred Hoffman Sign (cervical cord compression): R: Not examined L: Not examined ULTT Median: R: Not examined L: Not examined ULTT Ulnar: R: Not examined L: Not examined ULTT Radial: R: Not examined L: Not examined   AROM  AROM (Normal range in degrees) AROM 04/18/2022  Cervical  Flexion (50) WNL  Extension (80) WNL  Right lateral flexion (45) WNL  Left lateral flexion (45) WNL  Right rotation (85) WNL  Left rotation (85) WNL   Right Left  Shoulder    Flexion WNL WNL  Extension    Abduction WNL WNL  External Rotation    Internal Rotation    Hands Behind Head    Hands Behind Back        Elbow    Flexion WNL WNL  Extension WNL WNL  Pronation WNL WNL  Supination WNL WNL      Wrist    Flexion WNL* WNL  Extension WNL* WNL  Radial Deviation WNL WNL  Ulnar Deviation WNL WNL  (* = pain; Blank rows = not tested)    LE MMT:  MMT (out of 5) Right 04/18/2022 Left 04/18/2022  Cervical (isometric)  Flexion   Extension   Lateral Flexion    Rotation        Shoulder   Flexion 5 5  Extension    Abduction 5 5  External rotation 5 5  Internal rotation 5 5  Horizontal abduction    Horizontal adduction    Lower Trapezius    Rhomboids        Elbow  Flexion 5* 5  Extension 5 5  Pronation 5* 5  Supination 5 5      Wrist  Flexion 5* 5  Extension 5 5  Radial deviation 5* 5  Ulnar deviation 5 5      MCP  Flexion 5* 5  Extension 5* 5  Abduction 5 5  Adduction 5 5  (* = pain; Blank rows = not tested)  Grip strength L: 46, 44.3, 48.7 (46.3#) R: 44.4, 47.8, 50.2 (47.5#), maximal grip testing is painful  R grip pain-free threshold: 36#  Sensation Grossly intact to light touch bilateral UE as determined by testing dermatomes C2-T2.  Proprioception and hot/cold testing deferred on this date.   Reflexes Deferred   Palpation Pt reports pain to palpation over R medial epicondyle over common wrist flexor tendon insertion. Pain to palpation distally along common wrist flexor mass as well. No pain on lateral aspect of R elbow or over lateral epicondyle. No pain distally at wrist;  Repeated Movements Deferred   Passive Accessory Intervertebral Motion Deferred  Muscle Length Testing Deferred  Beighton scale:  Deferred   TODAY'S TREATMENT   SUBJECTIVE: Pt reports that she is doing well today. She continues to experience improvement with her R medial elbow pain and states that there are times where she will go multiple hours and forget about it entirely. She has been able to do bicep curls at the gym without an increase in her. She did notice a slight increase in her medial elbow pain with eccentric pronator strengthening but the additional HEP does not increase her pain. She has been using her tennis elbow brace on the medial forearm sparingly as needed with activity.  PAIN: Medial Elbow Pain: 0/10 at rest;   Manual Therapy  IASTM R forearm mm: pronator teres, FCU, FCR, FDS R ulnar distraction with elbow flexed to 90 degrees, grade II-III, 20s/bout x 3 bouts;   Ther-ex  Straight R elbow wrist extension stretch 3 x 45s; Seated R wrist flexor concentric strengthening with 2# dumbbell 3 x 10; Hammer radial deviation strengthening 3 x 10; Pain-free strength 60.3#; Green putty composite gripping x multiple bouts; HEP updated and education provided;   PATIENT EDUCATION:  Education details: Plan of care and HEP Person educated: Patient Education method: Explanation, verbal cues, and handout Education comprehension: verbalized understanding and returned demonstration   HOME EXERCISE PROGRAM: Access Code: [email or web address] URL: [email or web address] Date: 04/18/2022 Prepared by: Ria Comment  Exercises - Isometric Wrist Pronation  - 1 x daily - 7 x weekly - 2 sets - 10 reps - 5-6s hold - Forearm Pronation and Supination with Hammer (Mirrored)  - 1 x daily - 7 x weekly - 2 sets - 10 reps - Seated Wrist Radial Deviation with Dumbbell  - 1 x daily - 7 x weekly - 2 sets - 10 reps - Seated Wrist Flexion with Dumbbell  - 1 x daily - 7 x weekly - 2 sets - 10 reps - Wrist Flexor Stretch with Elbow Flexed: Progression From Elbow at Side  - 2 x daily - 7 x weekly - 3 reps - 30-45s hold  Patient Education - Golfer's Elbow - Ice Massage   ASSESSMENT:  CLINICAL IMPRESSION: Pt denies increase in her pain during therapy session today. She is able to progress to concentric R wrist flexor and radial deviator strengthening. Regressed HEP to isometric R pronator strengthening with indication about when to return to eccentric strengthening. Utilized IASTM to forearm flexors again today however trigger point dry needling deferred. HEP updated with patient during visit. Appointments stretched to once every other week. Pt will benefit from PT services to address deficits in strength and pain in order to return to full function at home, work, and with exercise.   REHAB POTENTIAL: Excellent  CLINICAL DECISION MAKING: Stable/uncomplicated  EVALUATION COMPLEXITY: Low   GOALS: Goals reviewed with patient? Yes  SHORT TERM GOALS: Target date: 04/20/22  Pt will be independent with HEP to improve strength and decrease elbow pain to improve pain-free function at home, work, and with  exercise Baseline:  Goal status: INITIAL   LONG TERM GOALS: Target date: 05/11/22   Pt will increase FOTO to at least 77 to demonstrate significant improvement in function at home and work related to neck pain  Baseline: 03/30/22: 69 Goal status: INITIAL  2.  Pt will return to full pain-free R grip strength (47.5#) in order to improve her ability to exercise and work without an increase in her pain Baseline:  03/30/22: R grip pain-free threshold: 36#, 04/18/22: 60.3# pain-free Goal status: ACHIEVED  3.  Pt will decrease quickDASH score by at least 8% in order to demonstrate clinically significant reduction in disability related to shoulder pain        Baseline: 03/30/22: 15.9% Goal status: INITIAL    PLAN: PT FREQUENCY: 1-2x/week  PT DURATION: 6 weeks  PLANNED INTERVENTIONS: Therapeutic exercises, Therapeutic activity, Neuromuscular re-education, Balance training, Gait training, Patient/Family education, Joint manipulation, Joint mobilization, Vestibular training, Canalith repositioning, Aquatic Therapy, Dry Needling, Electrical stimulation, Spinal manipulation, Spinal mobilization, Cryotherapy, Moist heat, Traction, Ultrasound, Ionotophoresis 4mg /ml Dexamethasone, and Manual therapy  PLAN FOR NEXT SESSION: Review and modify HEP as needed (continue eccentrics at higher load and progress to concentric strengthening), STM/IASTM to common wrist flexor mass, stretches;  Sharalyn Ink Alaya Iverson PT, DPT, GCS  Millette Halberstam 04/18/2022, 4:09 PM

## 2022-04-18 ENCOUNTER — Ambulatory Visit: Payer: BC Managed Care – PPO

## 2022-04-18 DIAGNOSIS — M25521 Pain in right elbow: Secondary | ICD-10-CM | POA: Diagnosis not present

## 2022-04-25 ENCOUNTER — Ambulatory Visit: Payer: BC Managed Care – PPO | Attending: Sports Medicine

## 2022-04-25 DIAGNOSIS — M25521 Pain in right elbow: Secondary | ICD-10-CM | POA: Diagnosis not present

## 2022-04-25 NOTE — Therapy (Signed)
OUTPATIENT PHYSICAL THERAPY ELBOW TREATMENT/DISCHARGE   Patient Name: Mallory Simmons MRN: [id number] DOB:December 23, 1975, 46 y.o., female Today's Date: 04/25/2022   PT End of Session - 04/25/22 1114     Visit Number 5    Number of Visits 13    Date for PT Re-Evaluation 05/11/22    Authorization Type 03/30/22: [employer]: PY:KDXIP on [place]  Deductible:MET  Co ins:20%  OOP:$4890/$3255.85 remains    PT Start Time 1103    PT Stop Time 1145    PT Time Calculation (min) 42 min    Activity Tolerance Patient tolerated treatment well    Behavior During Therapy WFL for tasks assessed/performed              Past Medical History:  Diagnosis Date   Depression    Thyroid disease    History reviewed. No pertinent surgical history. There are no problems to display for this patient.   PCP: Gayland Curry, MD  REFERRING PROVIDER: Gayland Curry, MD  REFERRING DIAGNOSIS: Medial epicondylitis of right elbow (M77.01)  THERAPY DIAG: Pain in right elbow  RATIONALE FOR EVALUATION AND TREATMENT: Rehabilitation  ONSET DATE: 01/22/22 (approximate)  From Initial Evaluation  SUBJECTIVE:                                                                                                                                                                                         Chief Complaint: R elbow pain  Pertinent History R medial elbow pain which started early April 2023 while lifting free weights. It was particularly aggravated when performing bicep curls. She was also reaching her heaviest weight with her dumbbell bench press and this motion would aggravate her pain as well. Around that same time she was on a ropes course with her son and in order to help him she had to hold the rope with maximal R elbow and wrist flexion which caused acute pain. The patient has tried ibuprofen, relative rest and an arm sleeve with minimal benefit. She saw orthopedics who recommended a tennis elbow brace to  be warn on the medial side of the arm. She has not tried this yet because she is unclear exactly where to put the brace. She was also referred for physical therapy and advised that she can use topical Voltaren which she has not tried yet. 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   Pain:  Pain Intensity: Present: 0/10, Best: 0/10; Pain location: Medial right elbow at the medial epicondyle extending down the medial forearm; Pain Quality: sharp and achy  Radiating: Yes from the elbow  down the medial R forearm Numbness/Tingling: No Focal Weakness: No Aggravating factors: using mouse at work, weight lifting, sometimes hurts at night, hurts more with excessive use of her right thumb Relieving factors: ice, no change with ibuprofen/tylenol 24-hour pain behavior: worse in the AM History of prior neck, shoulder, or neck/shoulder injury, pain, surgery, or therapy: No Dominant hand: right Imaging: Yes  Prior level of function: Independent Occupational demands: Licensed conveyancer at [employer]: mountain biking, running, spending time with son Red flags (personal history of cancer, chills/fever, night sweats, nausea, vomiting, unrelenting pain): Negative  Precautions: None  Weight Bearing Restrictions: No  Patient Goals: Return to weightlifting without pain, work without pain    OBJECTIVE:   Patient Surveys  FOTO: 1, predicted improvement to 77 QuickDASH: 15.9%  Cognition Patient is oriented to person, place, and time.  Recent memory is intact.  Remote memory is intact.  Attention span and concentration are intact.  Expressive speech is intact.  Patient's fund of knowledge is within normal limits for educational level.    Gross Musculoskeletal Assessment Tremor: None Bulk: Normal Tone: Normal  Gait Deferred  Posture Mild forward head but otherwise WNL, full assessment deferred  Cervical Screen AROM: WFL and painless with  overpressure in all planes Spurlings A (ipsilateral lateral flexion/axial compression): R: Negative L: Negative Spurlings B (ipsilateral lateral flexion/contralateral rotation/axial compression): R: Negative L: Negative Repeated movement: Deferred Hoffman Sign (cervical cord compression): R: Not examined L: Not examined ULTT Median: R: Not examined L: Not examined ULTT Ulnar: R: Not examined L: Not examined ULTT Radial: R: Not examined L: Not examined   AROM  AROM (Normal range in degrees) AROM 04/25/2022  Cervical  Flexion (50) WNL  Extension (80) WNL  Right lateral flexion (45) WNL  Left lateral flexion (45) WNL  Right rotation (85) WNL  Left rotation (85) WNL   Right Left  Shoulder    Flexion WNL WNL  Extension    Abduction WNL WNL  External Rotation    Internal Rotation    Hands Behind Head    Hands Behind Back        Elbow    Flexion WNL WNL  Extension WNL WNL  Pronation WNL WNL  Supination WNL WNL      Wrist    Flexion WNL* WNL  Extension WNL* WNL  Radial Deviation WNL WNL  Ulnar Deviation WNL WNL  (* = pain; Blank rows = not tested)    LE MMT:  MMT (out of 5) Right 04/25/2022 Left 04/25/2022  Cervical (isometric)  Flexion   Extension   Lateral Flexion    Rotation        Shoulder   Flexion 5 5  Extension    Abduction 5 5  External rotation 5 5  Internal rotation 5 5  Horizontal abduction    Horizontal adduction    Lower Trapezius    Rhomboids        Elbow  Flexion 5* 5  Extension 5 5  Pronation 5* 5  Supination 5 5      Wrist  Flexion 5* 5  Extension 5 5  Radial deviation 5* 5  Ulnar deviation 5 5      MCP  Flexion 5* 5  Extension 5* 5  Abduction 5 5  Adduction 5 5  (* = pain; Blank rows = not tested)  Grip strength L: 46, 44.3, 48.7 (46.3#) R: 44.4, 47.8, 50.2 (47.5#), maximal grip testing is painful  R grip pain-free threshold: 36#  Sensation Grossly intact to light touch bilateral UE as determined by testing dermatomes  C2-T2. Proprioception and hot/cold testing deferred on this date.   Reflexes Deferred   Palpation Pt reports pain to palpation over R medial epicondyle over common wrist flexor tendon insertion. Pain to palpation distally along common wrist flexor mass as well. No pain on lateral aspect of R elbow or over lateral epicondyle. No pain distally at wrist;  Repeated Movements Deferred   Passive Accessory Intervertebral Motion Deferred  Muscle Length Testing Deferred  Beighton scale:  Deferred   TODAY'S TREATMENT   SUBJECTIVE: Pt reports that she is doing well today and denies any resting pain. She continues to experience improvement with her R medial elbow pain and states that it is at least 75% improved compared to when she started therapy. She is performing her HEP and continues to notice pain if she attempts eccentric R pronator strengthening. Pt has been able to do bicep curls at the gym however does experience pain if she increases the weight excessively. She has been using her tennis elbow brace on the medial forearm very infrequently and only as needed with activity. She feels comfortable progressing her exercises independently and is ready to discharge.    PAIN: Medial Elbow Pain: 0/10 at rest;   Ther-ex  Straight R elbow wrist extension stretch 3 x 30s; Seated R wrist flexor concentric strengthening with 7# dumbbell 3 x 10; Seated R radial deviation strengthening with hammer 3 x 10; Seated R eccentric pronator strengthening with hammer x 3, pt begins having pain so regressed to isometrics; Seated isometric R pronator strengthening using handshake 3s hold x 10 at neutral, x 10 at fully supinated position, afterward returned to eccentrics and pt is able to complete 10 reps pain-free;  Outcome measures updated with patient: QuickDASH: 2.3% FOTO: 80  HEP updated and discharge education provided;    PATIENT EDUCATION:  Education details: Plan of care and HEP Person  educated: Patient Education method: Explanation, verbal cues, and handout Education comprehension: verbalized understanding and returned demonstration   HOME EXERCISE PROGRAM: Access Code: [email or web address] URL: [email or web address] Date: 04/25/2022 Prepared by: Roxana Hires  Exercises - Isometric Wrist Pronation  - 1 x daily - 7 x weekly - 2 sets - 10 reps - 5-6s hold - Forearm Pronation and Supination with Hammer (Mirrored)  - 1 x daily - 7 x weekly - 2 sets - 10 reps - Seated Wrist Radial Deviation with Dumbbell  - 1 x daily - 7 x weekly - 2 sets - 10 reps - Seated Wrist Flexion with Dumbbell  - 1 x daily - 7 x weekly - 2 sets - 10 reps - Standing Hip Controlled Articular Rotations  - 1 x daily - 7 x weekly - 3 in each direction hold - Putty Squeezes  - 1 x daily - 7 x weekly - 2 sets - 10 reps - 3s hold - Wrist Flexor Stretch with Elbow Flexed: Progression From Elbow at Side  - 2 x daily - 7 x weekly - 3 reps - 30-45s hold  Patient Education - Golfer's Elbow - Ice Massage   ASSESSMENT:  CLINICAL IMPRESSION: Updated outcome measures with patient during visit today. Her FOTO score improved from 69 at initial evaluation to 80 today and her QuickDASH decreased from 15.9% to 2.3%. Her right grip pain-free threshold was 36# at her initial evaluation and 60.3# during her visit on 04/18/22. Overall she reports at least 75% improvement in symptoms since starting with  therapy. She denies increase in her pain during session today with the exception of pain with eccentric R pronator strengthening. Regressed to isometric strengthening for R pronator at neutral and end range supination and afterward pt is able to resume eccentrics without pain. She is able to progress resistance with concentric R wrist flexor strengthening as well. HEP updated with patient during visit and reviewed discharge plan. Updated HEP during session today and assured that pt understands how to progress independently.  Pt encouraged to call and reschedule if symptoms worsen or fail to improve. Pt has met all of her goals and will be discharged on this date.   REHAB POTENTIAL: Excellent  CLINICAL DECISION MAKING: Stable/uncomplicated  EVALUATION COMPLEXITY: Low   GOALS: Goals reviewed with patient? Yes  SHORT TERM GOALS: Target date: 04/20/22  Pt will be independent with HEP to improve strength and decrease elbow pain to improve pain-free function at home, work, and with exercise Baseline:  Goal status: INITIAL   LONG TERM GOALS: Target date: 05/11/22   Pt will increase FOTO to at least 77 to demonstrate significant improvement in function at home and work related to neck pain  Baseline: 03/30/22: 69; 04/25/22: 80; Goal status: ACHIEVED  2.  Pt will return to full pain-free R grip strength (47.5#) in order to improve her ability to exercise and work without an increase in her pain Baseline: 03/30/22: R grip pain-free threshold: 36#, 04/18/22: 60.3# pain-free Goal status: ACHIEVED  3.  Pt will decrease quickDASH score by at least 8% in order to demonstrate clinically significant reduction in disability related to shoulder pain        Baseline: 03/30/22: 15.9%; 04/25/22: 2.3% Goal status: ACHIEVED    PLAN: PT FREQUENCY: 1-2x/week  PT DURATION: 6 weeks  PLANNED INTERVENTIONS: Therapeutic exercises, Therapeutic activity, Neuromuscular re-education, Balance training, Gait training, Patient/Family education, Joint manipulation, Joint mobilization, Vestibular training, Canalith repositioning, Aquatic Therapy, Dry Needling, Electrical stimulation, Spinal manipulation, Spinal mobilization, Cryotherapy, Moist heat, Traction, Ultrasound, Ionotophoresis 22m/ml Dexamethasone, and Manual therapy  PLAN FOR NEXT SESSION: Discharge  JLyndel SafeHuprich PT, DPT, GCS  Tashiya Souders 04/25/2022, 3:47 PM

## 2022-08-12 ENCOUNTER — Ambulatory Visit
Admission: EM | Admit: 2022-08-12 | Discharge: 2022-08-12 | Disposition: A | Discharge status: Home / Self Care | Payer: BC Managed Care – PPO | Attending: Family Medicine | Admitting: Family Medicine

## 2022-08-12 DIAGNOSIS — N760 Acute vaginitis: Secondary | ICD-10-CM

## 2022-08-12 DIAGNOSIS — B9689 Other specified bacterial agents as the cause of diseases classified elsewhere: Secondary | ICD-10-CM | POA: Diagnosis present

## 2022-08-12 DIAGNOSIS — B3731 Acute candidiasis of vulva and vagina: Secondary | ICD-10-CM

## 2022-08-12 LAB — URINALYSIS, ROUTINE W REFLEX MICROSCOPIC
Bilirubin Urine: NEGATIVE
Glucose, UA: NEGATIVE mg/dL
Hgb urine dipstick: NEGATIVE
Ketones, ur: NEGATIVE mg/dL
Leukocytes,Ua: NEGATIVE
Nitrite: NEGATIVE
Protein, ur: NEGATIVE mg/dL
Specific Gravity, Urine: <1.005 — ABNORMAL LOW (ref 1.005–1.030)
pH: 6 (ref 5.0–8.0)

## 2022-08-12 LAB — WET PREP, GENITAL
Sperm: NONE SEEN
Trich, Wet Prep: NONE SEEN
WBC, Wet Prep HPF POC: <10

## 2022-08-12 MED ORDER — METRONIDAZOLE 500 MG PO TABS: 500.0000 mg | ORAL_TABLET | Freq: Two times a day (BID) | ORAL | 0 refills | Status: AC

## 2022-08-12 MED ORDER — FLUCONAZOLE 150 MG PO TABS: 150.0000 mg | ORAL_TABLET | ORAL | 0 refills | Status: AC

## 2022-08-12 NOTE — Discharge Instructions (Signed)
Stop by the pharmacy to pick up your prescriptions.  Take the Diflucan every 3 days for 3 doses.  Take the metronidazole twice a day for 7 full days.  Remember not to drink alcohol while taking this medication.

## 2022-08-12 NOTE — ED Triage Notes (Signed)
Patient reports that she is having vaginal discharge, vaginal itchiness, and vaginal irration -- started Monday of this week.

## 2022-08-12 NOTE — ED Provider Notes (Signed)
[facility]    CSN: [id number] Arrival date & time: 08/12/22  1013      History   Chief Complaint Chief Complaint  Patient presents with   Vaginal Discharge   Vaginal Itching     HPI HPI Mallory Simmons is a 46 y.o. female.   [name] presents for vaginal discharge with itching and burning. She used Monistat about 3 days ago. Symptoms improved but didn't go away. Itching improved.  Continues to have burning when she pees.  No urinary frequency or urgency.  No hematuria.  She is not concerned for STIs. No fevers, back pain.    Past Medical History:  Diagnosis Date   Depression    Thyroid disease     There are no problems to display for this patient.   History reviewed. No pertinent surgical history.  OB History   No obstetric history on file.      Home Medications    Prior to Admission medications   Medication Sig Start Date End Date Taking? Authorizing Provider  fluconazole (DIFLUCAN) 150 MG tablet Take 1 tablet (150 mg total) by mouth every 3 (three) days for 3 doses. 08/12/22 08/19/22 Yes Erina Hamme, DO  metroNIDAZOLE (FLAGYL) 500 MG tablet Take 1 tablet (500 mg total) by mouth 2 (two) times daily. 08/12/22  Yes Elvyn Krohn, DO  DULoxetine (CYMBALTA) 60 MG capsule Take 60 mg by mouth daily.    [provider]  levothyroxine (SYNTHROID, LEVOTHROID) 100 MCG tablet TAKE 1 TABLET BY MOUTH EVERY DAY 12/08/17   Malachy Mood, MD  Multiple Vitamin (MULTIVITAMIN) tablet Take 1 tablet by mouth daily.    [provider]  naproxen (NAPROSYN) 500 MG tablet Take 1 tablet (500 mg total) by mouth 2 (two) times daily with a meal. 10/23/17   Lorin Picket, PA-C    Family History Family History  Problem Relation Age of Onset   Breast cancer Mother    Hypertension Mother    Prostate cancer Father     Social History Social History   Tobacco Use   Smoking status: Never   Smokeless tobacco: Never  Vaping Use   Vaping Use:  Never used  Substance Use Topics   Alcohol use: Yes    Comment: social   Drug use: No     Allergies   Patient has no known allergies.   Review of Systems Review of Systems: :negative unless otherwise stated in HPI.      Physical Exam Triage Vital Signs ED Triage Vitals  Enc Vitals Group     BP 08/12/22 1026 (!) 150/103     Pulse Rate 08/12/22 1026 82     Resp --      Temp 08/12/22 1026 98.3 F (36.8 C)     Temp Source 08/12/22 1026 Oral     SpO2 08/12/22 1026 98 %     Weight --      Height 08/12/22 1025 '5\' 7"'$  (1.702 m)     Head Circumference --      Peak Flow --      Pain Score 08/12/22 1024 5     Pain Loc --      Pain Edu? --      Excl. in [place]? --    No data found.  Updated Vital Signs BP (!) 150/103 (BP Location: Left Arm)   Pulse 82   Temp 98.3 F (36.8 C) (Oral)   Ht '5\' 7"'$  (1.702 m)   LMP 07/28/2022 (Approximate)  SpO2 98%   BMI 24.12 kg/m   Visual Acuity Right Eye Distance:   Left Eye Distance:   Bilateral Distance:    Right Eye Near:   Left Eye Near:    Bilateral Near:     Physical Exam GEN: well appearing female in no acute distress  CVS: well perfused  RESP: speaking in full sentences without pause  ABD: soft, non-tender, non-distended, no palpable masses   GU: deferred, patient performed self swab     UC Treatments / Results  Labs (all labs ordered are listed, but only abnormal results are displayed) Labs Reviewed  WET PREP, GENITAL - Abnormal; Notable for the following components:      Result Value   Yeast Wet Prep HPF POC PRESENT (*)    Clue Cells Wet Prep HPF POC PRESENT (*)    All other components within normal limits  URINALYSIS, ROUTINE W REFLEX MICROSCOPIC - Abnormal; Notable for the following components:   Specific Gravity, Urine <1.005 (*)    All other components within normal limits    EKG   Radiology No results found.  Procedures Procedures (including critical care time)  Medications Ordered in  UC Medications - No data to display  Initial Impression / Assessment and Plan / UC Course  I have reviewed the triage vital signs and the nursing notes.  Pertinent labs & imaging results that were available during my care of the patient were reviewed by me and considered in my medical decision making (see chart for details).     Patient is a 46 y.o. female  who presents for 5 days of  dysuria and vaginal discharge.  Overall patient is well-appearing and afebrile.  Vital signs stable.  UA consistent not consistent with acute cystitis.  She has yeast and Clue cells on wet prep.  History consistent with yeast infection.  Given she took Monistat she may have thrown off her pH and causes bacterial vaginosis to grow.  Treat for both yeast infection and bacterial vaginosis with Diflucan for 3 doses and metronidazole for 7 days.  Advised patient to not drink alcohol while taking this medication. Return precautions including abdominal pain, fever, chills, nausea, or vomiting given.   Discussed MDM, treatment plan and plan for follow-up with patient/parent who agrees with plan.      Final Clinical Impressions(s) / UC Diagnoses   Final diagnoses:  BV (bacterial vaginosis)  Yeast vaginitis     Discharge Instructions      Stop by the pharmacy to pick up your prescriptions.  Take the Diflucan every 3 days for 3 doses.  Take the metronidazole twice a day for 7 full days.  Remember not to drink alcohol while taking this medication.     ED Prescriptions     Medication Sig Dispense Auth. Provider   fluconazole (DIFLUCAN) 150 MG tablet Take 1 tablet (150 mg total) by mouth every 3 (three) days for 3 doses. 3 tablet Azyria Osmon, DO   metroNIDAZOLE (FLAGYL) 500 MG tablet Take 1 tablet (500 mg total) by mouth 2 (two) times daily. 14 tablet Aksh Swart, Ronnette Juniper, DO      PDMP not reviewed this encounter.   Lyndee Hensen, DO 08/12/22 1106

## 2022-09-19 ENCOUNTER — Telehealth: Payer: Self-pay

## 2022-09-19 ENCOUNTER — Other Ambulatory Visit: Payer: Self-pay

## 2022-09-19 DIAGNOSIS — Z1211 Encounter for screening for malignant neoplasm of colon: Secondary | ICD-10-CM

## 2022-09-19 MED ORDER — NA SULFATE-K SULFATE-MG SULF 17.5-3.13-1.6 GM/177ML PO SOLN: 1.0000 | Freq: Once | ORAL | 0 refills | Status: AC

## 2022-09-19 NOTE — Telephone Encounter (Signed)
Gastroenterology Pre-Procedure Review  Request Date: 11/14/22 Requesting Physician: Dr. Allen Norris  PATIENT REVIEW QUESTIONS: The patient responded to the following health history questions as indicated:    1. Are you having any GI issues? no 2. Do you have a personal history of Polyps? no 3. Do you have a family history of Colon Cancer or Polyps? no 4. Diabetes Mellitus? no 5. Joint replacements in the past 12 months? No joint replacements however she had a tubal ligation  in May 2023 6. Major health problems in the past 3 months? See above note 7. Any artificial heart valves, MVP, or defibrillator?no    MEDICATIONS & ALLERGIES:    Patient reports the following regarding taking any anticoagulation/antiplatelet therapy:   Plavix, Coumadin, Eliquis, Xarelto, Lovenox, Pradaxa, Brilinta, or Effient? no Aspirin? no  Patient confirms/reports the following medications:  Current Outpatient Medications  Medication Sig Dispense Refill   DULoxetine (CYMBALTA) 60 MG capsule Take 60 mg by mouth daily.     levothyroxine (SYNTHROID, LEVOTHROID) 100 MCG tablet TAKE 1 TABLET BY MOUTH EVERY DAY 30 tablet 11   metroNIDAZOLE (FLAGYL) 500 MG tablet Take 1 tablet (500 mg total) by mouth 2 (two) times daily. 14 tablet 0   Multiple Vitamin (MULTIVITAMIN) tablet Take 1 tablet by mouth daily.     naproxen (NAPROSYN) 500 MG tablet Take 1 tablet (500 mg total) by mouth 2 (two) times daily with a meal. 60 tablet 0   No current facility-administered medications for this visit.    Patient confirms/reports the following allergies:  No Known Allergies  No orders of the defined types were placed in this encounter.   AUTHORIZATION INFORMATION Primary Insurance: 1D#: Group #:  Secondary Insurance: 1D#: Group #:  SCHEDULE INFORMATION: Date: 11/14/22 Time: Location: [place]

## 2022-09-20 NOTE — Therapy (Incomplete)
OUTPATIENT PHYSICAL THERAPY KNEE EVALUATION   Patient Name: Mallory Simmons MRN: [id number] DOB:October 23, 1976, 46 y.o., female Today's Date: 09/20/2022    Past Medical History:  Diagnosis Date   Depression    Thyroid disease    No past surgical history on file. Patient Active Problem List   Diagnosis Date Noted   Pain in left foot 04/06/2022   Valgus deformity of both great toes 04/06/2022   Essential hypertension 02/09/2022   Excessive bleeding in premenopausal period 10/02/2020   Acne 40/98/1191   Monoallelic mutation of ATM gene 06/19/2019   Restless legs 02/25/2017   Dysplastic nevus 12/30/2015   Hypertrophic scar 12/30/2015   Anxiety and depression 12/29/2015   Hypothyroidism 12/29/2015    PCP: Gayland Curry, MD  REFERRING PROVIDER: Gayland Curry, MD  REFERRING DIAGNOSIS: ***  THERAPY DIAG: No diagnosis found.  RATIONALE FOR EVALUATION AND TREATMENT: {HABREHAB:27488}  ONSET DATE: ***  FOLLOW UP APPT WITH PROVIDER: {yes/no:20286}    SUBJECTIVE:                                                                                                                                                                                         Chief Complaint: ***  Pertinent History Shawndell Varas is here today for evaluation of left hamstring pain, referred by self. Pain began approximately a month and a half ago. Patient reports that she was running a 10K race, at the end of the race she reported mild pain in the back of the left hamstring region. At this moment she endorses the pain is mostly after running a distance of 1 mile or more, denies any pain or discomfort on daily activities. Denies any numbness, tingling, swelling. Denies any pain at sleep. Interested in keep training, interested in physical therapy.   Focused Musculoskeletal Exam :   Left Knee exam Gait: Normal Alignment: _0  Normal _1  Anatomic valgus _2  Anatomic varus Inspection: _3  Normal _4  Ecchymosis  present _5 Other  Palpation: Joint Tenderness: _6  No Tenderness _7  MJL _8  LJL _9  MCL Margin _10  LCL Margin _11  Pes Anserine _12  Distal Quadricep _13  Other- distal hamstring muscle belly medially  Peripatellar Tenderness: _14  None _15  Lateral pole _16  Medial pole _17  Superior pole _18  Inferior pole  Patellar tendon pain: _19  None _20  Present  Patellar apprehension: _21  None _22  Present  Patellar motion: _23  Normal _24  Abnormal Crepitus: _25  None _26  Present Effusion: _27  None _28  Trace _29  1+ _30  2+ _31  3+ Range of motion: Flexion: 135, Extension: 0 Strength: _32  Full in all planes, including intact extensor mechanism _33  Limited as described Hamstring activation: pain over hamstring belly, with 90 degree and 45 degree knee  flexion. No pain on tendon insertions.  Neurologic: Normal sensation  Vascular: Normal pulses  Special tests:  Lachman: Negative Anterior Drawer: Negative Sag/quad Activation: NP Posterior Drawer: Negative Valgus Stress: Negative at 0/30 Varus Stress: Negative at 0/30 McMurray's: Negative Thessaly: NP   Other notable findings/comments: None  L knee XR 09/13/22: Bones: No acute fracture or dislocation.   Alignment: Anatomic.  Joint spaces: Preserved.  Small patellofemoral osteophytes. No significant  effusion.  Soft tissues: Visualized soft tissues are unremarkable.  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 Reviewed imaging as above. Overall feel she will benefit most from dedicated PT, which she prefers to do in [place] with a PT she knows.   PLAN:  Recommend initiate PT 1-2 times per week, referral placed today externally  Counseled on  HEP to begin in the interim We will hold off on MRI for now, consider if not progressing Counseled on RICE and OTC analgesics as sees benefit Education provided   Patient Instructions given to the patient.   Follow up with [place] in 6 weeks for re-eval, as needed.  Pain:  Pain Intensity: Present: /10, Best: /10, Worst: /10 Pain location: *** Pain quality: {PAIN DESCRIPTION:21022940}  Radiating pain: {yes/no:20286}  Swelling: {yes/no:20286}  Popping, catching, locking: {yes/no:20286}  Numbness/Tingling: {yes/no:20286} Focal weakness or buckling: {yes/no:20286} Aggravating factors: *** Relieving factors: *** 24-hour pain behavior: *** How long can you sit: How long can you stand: History of prior back, hip, or knee injury, pain, surgery, or therapy: {yes/no:20286} Falls: Has patient fallen in last 6 months? {yes/no:20286}, Number of falls: *** Follow-up appointment with MD: {yes/no:20286} Dominant hand: {RIGHT/LEFT:20294} Imaging: {yes/no:20286}  Prior level of function: {PLOF:24004} Occupational demands: Hobbies: Red flags (bowel/bladder changes, saddle paresthesia, personal history of cancer, h/o spinal tumors, h/o compression fx, h/o abdominal aneurysm, abdominal pain, chills/fever, night sweats, nausea, vomiting, unrelenting pain): Negative  Precautions: None  Weight Bearing Restrictions: No  Living Environment Lives with: {[facility] lives with:25569::"lives with their family"} Lives in: {Lives in:25570}   Patient Goals: ***    OBJECTIVE:   Patient Surveys  {rehab surveys:24030}   Cognition Patient is oriented to person, place, and time.  Recent memory is intact.  Remote memory is intact.  Attention span and concentration are intact.  Expressive speech is intact.  Patient's fund of knowledge is within normal limits for educational level.     Gross Musculoskeletal Assessment Tremor: None Bulk: Normal Tone: Normal No visible step-off along spinal column, no  signs of scoliosis   GAIT: Distance walked: *** Assistive device utilized: {Assistive devices:23999} Level of assistance: {Levels of assistance:24026} Comments: ***   Posture:   AROM  AROM (Normal range in degrees) AROM  09/20/2022  Lumbar   Flexion (65)   Extension (30)   Right lateral flexion (25)   Left lateral flexion (25)   Right rotation (30)   Left rotation (30)       Hip Right Left  Flexion (125)    Extension (15)    Abduction (40)    Adduction     Internal Rotation (45)    External Rotation (45)        Knee    Flexion (135)    Extension (0)  Ankle    Dorsiflexion (20)    Plantarflexion (50)    Inversion (35)    Eversion (15    (* = pain; Blank rows = not tested)   LE MMT:  MMT (out of 5) Right 09/20/2022 Left 09/20/2022  Hip flexion    Hip extension    Hip abduction    Hip adduction    Hip internal rotation    Hip external rotation    Knee flexion    Knee extension    Ankle dorsiflexion    Ankle plantarflexion    Ankle inversion    Ankle eversion    (* = pain; Blank rows = not tested)   Sensation Grossly intact to light touch bilateral LEs as determined by testing dermatomes L2-S2. Proprioception, and hot/cold testing deferred on this date.   Reflexes R/L Knee Jerk (L3/4): 2+/2+  Ankle Jerk (S1/2): 2+/2+    Muscle Length Hamstrings: R: {NEGATIVE/POSITIVE YWV:37106} L: {NEGATIVE/POSITIVE YIR:48546} Quadriceps Pat Patrick): R: {NEGATIVE/POSITIVE EVO:35009} L: {NEGATIVE/POSITIVE FGH:82993} Hip flexors Marcello Moores): R: {NEGATIVE/POSITIVE ZJI:96789} L: {NEGATIVE/POSITIVE FYB:01751} IT band Nicoletta Dress): R: {NEGATIVE/POSITIVE WCH:85277} L: {NEGATIVE/POSITIVE OEU:23536}   Palpation  Location LEFT  RIGHT           Quadriceps    Medial Hamstrings    Lateral Hamstrings    Lateral Hamstring tendon    Medial Hamstring tendon    Quadriceps tendon    Patella    Patellar Tendon    Tibial Tuberosity    Medial joint line    Lateral joint  line    MCL    LCL    Adductor Tubercle    Pes Anserine tendon    Infrapatellar fat pad    Fibular head    Popliteal fossa    (Blank rows = not tested) Graded on 0-4 scale (0 = no pain, 1 = pain, 2 = pain with wincing/grimacing/flinching, 3 = pain with withdrawal, 4 = unwilling to allow palpation), (Blank rows = not tested)   Passive Accessory Motion Tibiofemoral: Distraction: R: {NEGATIVE/POSITIVE RWE:31540} L: {NEGATIVE/POSITIVE GQQ:76195} Anterior Glide: R: {NEGATIVE/POSITIVE KDT:26712} L: {NEGATIVE/POSITIVE WPY:09983} Posterior Glide: R: {NEGATIVE/POSITIVE JAS:50539} L: {NEGATIVE/POSITIVE JQB:34193} Medial Glide: R: {NEGATIVE/POSITIVE XTK:24097} L: {NEGATIVE/POSITIVE DZH:29924} Lateral Glide: R: {NEGATIVE/POSITIVE QAS:34196} L: {NEGATIVE/POSITIVE QIW:97989}  Fibula on Femur: Anterior: R: {NEGATIVE/POSITIVE QJJ:94174} L: {NEGATIVE/POSITIVE YCX:44818} Posterior: R: {NEGATIVE/POSITIVE HUD:14970} L: {NEGATIVE/POSITIVE YOV:78588}  Patellofemoral: Superior Glide: R: {NEGATIVE/POSITIVE FOY:77412} L: {NEGATIVE/POSITIVE INO:67672} Inferior Glide: R: {NEGATIVE/POSITIVE CNO:70962} L: {NEGATIVE/POSITIVE EZM:62947} Medial Glide: R: {NEGATIVE/POSITIVE MLY:65035} L: {NEGATIVE/POSITIVE WSF:68127} Lateral Glide: R: {NEGATIVE/POSITIVE NTZ:00174} L: {NEGATIVE/POSITIVE BSW:96759}  Medial Tilt: R: {NEGATIVE/POSITIVE FMB:84665} L: {NEGATIVE/POSITIVE LDJ:57017} Lateral Tilt: R: {NEGATIVE/POSITIVE BLT:90300} L: {NEGATIVE/POSITIVE PQZ:30076}   VASCULAR Dorsalis pedis and posterior tibial pulses are palpable   SPECIAL TESTS  Ligamentous Stability  ACL: Lachman's: R: {NEGATIVE/POSITIVE AUQ:33354} L: {NEGATIVE/POSITIVE TGY:56389} Active Lachman's: R: {NEGATIVE/POSITIVE HTD:42876} L: {NEGATIVE/POSITIVE OTL:57262} Anterior Drawer: R: {NEGATIVE/POSITIVE MBT:59741} L: {NEGATIVE/POSITIVE ULA:45364} Pivot Shift: R: {NEGATIVE/POSITIVE WOE:32122} L: {NEGATIVE/POSITIVE QMG:50037}  PCL: Posterior  Drawer: R: {NEGATIVE/POSITIVE CWU:88916} L: {NEGATIVE/POSITIVE XIH:03888} Reverse Lachman's: R: {NEGATIVE/POSITIVE KCM:03491} L: {NEGATIVE/POSITIVE PHX:50569} Posterior Sag Sign: R: {NEGATIVE/POSITIVE VXY:80165} L: {NEGATIVE/POSITIVE VVZ:48270}  MCL: Valgus Stress (30 degrees flexion): R: {NEGATIVE/POSITIVE BEM:75449} L: {NEGATIVE/POSITIVE EEF:00712}  LCL: Varus Stress (30 degrees flexion): R: {NEGATIVE/POSITIVE RFX:58832} L: {NEGATIVE/POSITIVE PQD:82641}  Meniscus Tests McMurray's Test:  Medial Meniscus (Tibial ER): R: {NEGATIVE/POSITIVE RAX:09407} L: {NEGATIVE/POSITIVE WKG:88110} Lateral Meniscus (Tibial IR): R: {NEGATIVE/POSITIVE RPR:94585} L: {NEGATIVE/POSITIVE FYT:24462}  Thessaly: R: {NEGATIVE/POSITIVE FOR:19998} L: {NEGATIVE/POSITIVE MMN:81771} Ege's: R: {NEGATIVE/POSITIVE HAF:79038} L: {NEGATIVE/POSITIVE BFX:83291} Apley's:  R: {NEGATIVE/POSITIVE NGE:95284} L: {NEGATIVE/POSITIVE XLK:44010} Steinmann Sign I: R: {NEGATIVE/POSITIVE UVO:53664} L: {NEGATIVE/POSITIVE QIH:47425} Steinmann Sign II: R: {NEGATIVE/POSITIVE ZDG:38756} L: {NEGATIVE/POSITIVE EPP:29518} Axial Pivot-Shift: R: {NEGATIVE/POSITIVE ACZ:66063} L: {NEGATIVE/POSITIVE KZS:01093} Dynamic Knee Test: R: {NEGATIVE/POSITIVE ATF:57322} L: {NEGATIVE/POSITIVE GUR:42706} Duck Waddle Test: R: {NEGATIVE/POSITIVE CBJ:62831} L: {NEGATIVE/POSITIVE DVV:61607} Effusion: R: {NEGATIVE/POSITIVE PXT:06269} L: {NEGATIVE/POSITIVE SWN:46270}    Patellofemoral Pain Syndrome Patellar Tilt (Lateral): R: {NEGATIVE/POSITIVE JJK:09381} L: {NEGATIVE/POSITIVE WEX:93716} Patellar Apprehension: R: {NEGATIVE/POSITIVE RCV:89381} L: {NEGATIVE/POSITIVE OFB:51025} Squatting pain: R: {NEGATIVE/POSITIVE ENI:77824} L: {NEGATIVE/POSITIVE MPN:36144} Stair climbing pain: R: {NEGATIVE/POSITIVE FOR:19998} L: {NEGATIVE/POSITIVE RXV:40086} Kneeling pain: R: {NEGATIVE/POSITIVE FOR:19998} L: {NEGATIVE/POSITIVE PYP:95093} Resisted knee extension pain: R:  {NEGATIVE/POSITIVE FOR:19998} L: {NEGATIVE/POSITIVE OIZ:12458} Compression: R: {NEGATIVE/POSITIVE KDX:83382} L: {NEGATIVE/POSITIVE NKN:39767} Clarke's sign: R: {NEGATIVE/POSITIVE HAL:93790} L: {NEGATIVE/POSITIVE WIO:97353} Lateral Pull: R: {NEGATIVE/POSITIVE GDJ:24268} L: {NEGATIVE/POSITIVE TMH:96222}   Patellar Tendinopathy Inferior pole palpation with anterior tilt: R: {NEGATIVE/POSITIVE LNL:89211} L: {NEGATIVE/POSITIVE HER:74081}    Beighton Scale  LEFT  RIGHT           1. Passive dorsiflexion and hyperextension of the fifth MCP joint beyond 90  0 0   2. Passive apposition of the thumb to the flexor aspect of the forearm  0  0   3. Passive hyperextension of the elbow beyond 10  0  0   4. Passive hyperextension of the knee beyond 10  0  0   5. Active forward flexion of the trunk with the knees fully extended so that the palms of the hands rest flat on the floor   0   TOTAL         0/ 9     [place] Knee Rules for Acute Knee injury  Yes/No         1. Aged 55 years or over {yes/no:20286}   2. Tenderness at the head of the fibula {yes/no:20286}   3. Isolated tenderness of the patella  {yes/no:20286}   4. Inability to flex knee to 90 degrees {yes/no:20286}   5. Inability to bear weight (defined as an inability to take 4 steps, ie two steps on each leg, regardless of limping) immediately and at presentation {yes/no:20286}      TODAY'S TREATMENT  ***   PATIENT EDUCATION:  Education details: Plan of care Person educated: Patient Education method: Explanation Education comprehension: verbalized understanding   HOME EXERCISE PROGRAM: None currently   ASSESSMENT:  CLINICAL IMPRESSION: Patient is a *** y.o. *** who was seen today for physical therapy evaluation and treatment for knee pain. Objective impairments include {opptimpairments:25111}. These impairments are limiting patient from {activity limitations:25113}. Personal factors including {Personal factors:25162} are also  affecting patient's functional outcome. Patient will benefit from skilled PT to address above impairments and improve overall function.  REHAB POTENTIAL: {rehabpotential:25112}  CLINICAL DECISION MAKING: {clinical decision making:25114}  EVALUATION COMPLEXITY: {Evaluation complexity:25115}   GOALS: Goals reviewed with patient? Yes  SHORT TERM GOALS: Target date: {follow up:25551}  Pt will be independent with HEP to improve strength and decrease knee pain to improve pain-free function at home and work. Baseline: *** Goal status: INITIAL   LONG TERM GOALS: Target date: {follow up:25551}  Pt will increase FOTO to at least *** to demonstrate significant improvement in function at home and work related to knee pain  Baseline:  Goal status: INITIAL  2.  Pt will decrease worst knee pain by at least 3 points on the NPRS in order to demonstrate clinically significant reduction in back pain. Baseline: *** Goal status: INITIAL  3.  Pt will  decrease LEFS score by at least 9 points in order demonstrate clinically significant reduction in knee pain/disability.       Baseline: *** Goal status: INITIAL  4.  Pt will increase strength of *** by at least 1/2 MMT grade in order to demonstrate improvement in strength and function  Baseline: *** Goal status: INITIAL   PLAN: PT FREQUENCY: 1-2x/week  PT DURATION: 8 weeks  PLANNED INTERVENTIONS: Therapeutic exercises, Therapeutic activity, Neuromuscular re-education, Balance training, Gait training, Patient/Family education, Joint manipulation, Joint mobilization, Canalith repositioning, Aquatic Therapy, Dry Needling, Electrical stimulation, Spinal manipulation, Spinal mobilization, Cryotherapy, Moist heat, Taping, Traction, Ultrasound, Ionotophoresis 33m/ml Dexamethasone, and Manual therapy  PLAN FOR NEXT SESSION: ***   Quinnie Barcelo 09/20/2022, 12:44 PM

## 2022-09-21 ENCOUNTER — Ambulatory Visit: Payer: BC Managed Care – PPO | Attending: General Surgery

## 2022-09-21 DIAGNOSIS — S76302A Unspecified injury of muscle, fascia and tendon of the posterior muscle group at thigh level, left thigh, initial encounter: Secondary | ICD-10-CM | POA: Diagnosis not present

## 2022-09-21 DIAGNOSIS — Y9302 Activity, running: Secondary | ICD-10-CM

## 2022-09-21 DIAGNOSIS — R2689 Other abnormalities of gait and mobility: Secondary | ICD-10-CM | POA: Diagnosis not present

## 2022-09-21 DIAGNOSIS — X58XXXA Exposure to other specified factors, initial encounter: Secondary | ICD-10-CM | POA: Diagnosis not present

## 2022-09-21 DIAGNOSIS — S76312S Strain of muscle, fascia and tendon of the posterior muscle group at thigh level, left thigh, sequela: Secondary | ICD-10-CM

## 2022-09-21 NOTE — Therapy (Unsigned)
[facility] [address]. [place], [place], [address] Phone: [phone]   Fax:  [phone]  Physical Therapy Evaluation  Patient Details  Name: Mallory Simmons MRN: [id number] Date of Birth: 07/18/1976 Referring Provider (PT): Dayna Barker, MD   Encounter Date: 09/21/2022   PT End of Session - 09/21/22 1808     Visit Number 1    Number of Visits 16    Date for PT Re-Evaluation 11/16/22    Authorization Type [place] Time Period 09/21/22-12/14/2022    Progress Note Due on Visit 10    PT Start Time 1400    PT Stop Time 1455    PT Time Calculation (min) 55 min    Activity Tolerance Patient tolerated treatment well;No increased pain    Behavior During Therapy WFL for tasks assessed/performed             Past Medical History:  Diagnosis Date   Depression    Thyroid disease     No past surgical history on file.  There were no vitals filed for this visit.    Subjective Assessment -    Subjective Mallory Simmons finally stopped running 2 weeks ago, her hamstrings pain just wasn't getting better.    Pertinent History Mallory Simmons is a 79yoF who is referred by Dayna Barker MD to [facility] for a subacute hamstrings strain. Pt reports a finish line sprint at a 10k in October with acute hamstrings strain left. She was able to conitnue some running for a few weeks, but pain with running has not improve. Pt stopped running 11/15 in hopes to allow healing and start PT to address issue. Pt is not limited from doing ADL or IADL activity, but she does have increased soreness with prolonged sitting >93mn. Pt has conitnued to walk for cardio fitness workouts and conitnues to do resistance training with her trainer while avoiding exercises thought to be hamstrings speciifc (squats and romanian deadlifts). Pt seen by sports medicine who reports no notable findings with xray and MSUS. Pt is  familiar to this clinic for other episodes of care, seen here for running injury of Rt hip with good outcome and full return to sport.    How long can you sit comfortably? unlimited sitting tolerance, but notable achiness midbelly in 2 hours    How long can you stand comfortably? Unlimited    How long can you walk comfortably? Unlimited    Diagnostic tests Did xray in office; diagnositc ultrasound    Patient Stated Goals Return to running, personal training unrestricted.    Currently in Pain? No/denies                [facility]       Assessment   Medical Diagnosis Left hamstrings strain    Referring Provider (PT) HDayna Barker MD    Onset Date/Surgical Date 08/07/22    Prior Therapy PT here previously for other unrelated problems      Precautions   Precautions None      Restrictions   Weight Bearing Restrictions No      Balance Screen   Has the patient fallen in the past 6 months No    Has the patient had a decrease in activity level because of a fear of falling?  No    Is the patient reluctant to leave their home because of a fear of falling?  No  Prior Function   Level of Independence Independent    Vocation Full time employment   [employer], hiking, Charity fundraiser at gym      Observation/Other Assessments   Focus on Therapeutic Outcomes (FOTO)  71      Sensation   Light Touch Not tested   pt denies involvement     Coordination   Gross Motor Movements are Fluid and Coordinated Yes      Flexibility   Soft Tissue Assessment /Muscle Length yes    Hamstrings Hip at 90*: Lt: -20; Rt: -30    10 degree differential; need to be careful given presence of bilat recurvatum           Recurvatum Assessment:        BLE Strength Assessment      Right  Left  Hip Flexion 5/5 5-/5  Hip Horizontal ABDCT  5/5 5/5  Hip Horizontal ADD 5/5 5/5  Hip IR (seated)  5/5 5/5  Hip ER (seated) 5/5 5/5  Knee Extension 5/5 5/5  Knee  Flexion (seated) 5/5 5/5*  Ankle Dorsiflexion 5/5 5/5  Hip ABDCT (supine)  5/5 5/5  Hip Extension (Supine @ 30 degrees) 5/5 4/5*  Hip Extension (Prone SLR) 5/5 4+/5*  Hip Extension (Prone BLR) 5/5 5/5  Knee flexion (prone at 45 degrees) 5/5 5/5*  Knee flexion (prone at 90 degrees) 5/5 4/5**  *pain **PAIN    Objective measurements completed on examination: See above findings.   Sustained release Left medial hamstrings: 5 places with rapid taut band resolution   HEP Exploration -hooklying bridge 1x10x3secH (self selected knee flexion angle to obtain tolerable HS activation) -hooklying straight leg hamstrings stretch Simmons belt: 2x45sec -discussion regarding trial of knee flexion machine at gym -may consider alternate addition with band for home next session -asked to refrain from running until HEP tolerance is established      PT Education -    Education Details typical stages for rehab for return to sport; how to modify intensity in resumption of moderate resistance loading exercises.    Person(s) Educated Patient    Methods Explanation;Demonstration;Handout;Verbal cues    Comprehension Verbalized understanding;Returned demonstration;Need further instruction              PT Short Term Goals -       PT SHORT TERM GOAL #1   Title Pt to report return to modified squat and RDL at gym with moderate loading that does not exacerbate pain.    Baseline eval: Pt has refrained from RDL and squat since injury    Time 4    Period Weeks    Status New    Target Date 10/19/22      PT SHORT TERM GOAL #2   Title Pt to tolerate MMT of seated Left knee flexion, prone knee flexion at 45 degrees without apprehension or giving way.    Baseline eval: unable to give maximal effort due to pain apprehension with load    Time 4    Period Weeks    Status New    Target Date 10/19/22      PT SHORT TERM GOAL #3   Title Pt to report absence of pain exacerbation from work-related sitting  actiivty.    Baseline eval: pain after 30 minutes, but tolerates activity as needed    Time 4    Period Weeks    Status New    Target Date 10/19/22  PT Long Term Goals -       PT LONG TERM GOAL #1   Title Pt to improve FOTO survey score >5 points to indicated improved participation in meaningful occupations of fitness and leisure.    Baseline Eval: 71    Time 6    Period Weeks    Status New    Target Date 11/02/22      PT LONG TERM GOAL #2   Title Pt to report success in walk/run return to run program with predictable and unconcerning soreness in her hamstrings area.    Baseline eval: has not return to any running yet    Time 6    Period Weeks    Status New    Target Date 11/02/22      PT LONG TERM GOAL #3   Title Pt to demonstrate 10RM or dynamometry of knee flexion and straight leg hip extension with less than 10% asymetry.    Baseline eval: not approrpaite for testing beyond MMT at this time    Time 8    Period Weeks    Status New    Target Date 11/16/22      PT LONG TERM GOAL #4   Title Pt to report return to running 15 miles weekly at self selected pace running 3x/week.    Baseline eval: not running    Time 8    Period Weeks    Status New    Target Date 11/16/22      PT LONG TERM GOAL #5   Title Pt to report tolerance to interval workouts of 4x41mor 8x2063mt 75-85% effort without antalgic gait and without >48 hours of post workout soreness >3/10 ot indicate improves sprot-specific load tolerance of hamstrings.    Baseline eval: has not run intervals in several months    Time 10    Period Weeks    Status New    Target Date 11/30/22                    Plan -     Clinical Impression Statement Examination relvealing of persistent pain with force production in the hamstirngs, most notable with resisted knee flexion and resisted straight-knee hip extension. Pt has tenderness to plapation and active triggerpoints in the medial  hamtrings left which respond favorably to ischemic release. Pt is unable to resume her typical running and fitness activity due to pain and has pain with prolonged sitting, a task required for work. Pt will benefit from physical therapy to restore normal muscle length, strength, and tissue load tolerance to allow for normal return to sport and improved quality of life.    Personal Factors and Comorbidities Age;Fitness;Behavior Pattern;Past/Current Experience;Time since onset of injury/illness/exacerbation;Education    Examination-Activity Limitations Sit;Squat    Examination-Participation Restrictions Occupation;Community Activity;Interpersonal Relationship;Yard Work    Stability/Clinical Decision Making Stable/Uncomplicated    ClDesigner, jewelleryow    Rehab Potential Excellent    PT Frequency 2x / week    PT Duration 12 weeks    PT Treatment/Interventions Gait training;Electrical Stimulation;Cryotherapy;Moist Heat;Therapeutic activities;Therapeutic exercise;Manual techniques;Scar mobilization    PT Next Visit Plan Discuss response to HEP, assess RDL and squat for load presciption, discuss a timeframe for intitaitng a walk/run return to running program.    PT Home Exercise Plan Access Code: [email or web address]: [email or web address]  Date: 09/21/2022  Prepared by: AlRebbeca Paul  Exercises  - Supine Bridge  - 1 x daily - 5 x weekly -  3 sets - 12 reps - 3 hold  - Hooklying Hamstring Stretch with Strap  - 1 x daily - 5 x weekly - 1 sets - 3 reps - 45 hold  - Hamstring Curl with Weight Machine  - 1 x daily - 3 x weekly - 3 sets - 15 reps - 1 hold    Consulted and Agree with Plan of Care Patient             Patient will benefit from skilled therapeutic intervention in order to improve the following deficits and impairments:  Abnormal gait, Decreased activity tolerance, Decreased strength, Decreased scar mobility, Increased fascial restricitons, Increased muscle spasms,  Hypermobility  Visit Diagnosis: Strain of left hamstring muscle, sequela  Injury while running  Other abnormalities of gait and mobility     Problem List Patient Active Problem List   Diagnosis Date Noted   Pain in left foot 04/06/2022   Valgus deformity of both great toes 04/06/2022   Essential hypertension 02/09/2022   Excessive bleeding in premenopausal period 10/02/2020   Acne 86/38/1771   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 [address]. [place], [place], [address] Phone: [phone]   Fax:  [phone]  Name: Mallory Simmons MRN: [id number] Date of Birth: June 16, 1976

## 2022-09-29 ENCOUNTER — Ambulatory Visit: Payer: BC Managed Care – PPO | Attending: General Surgery

## 2022-09-29 DIAGNOSIS — M6281 Muscle weakness (generalized): Secondary | ICD-10-CM | POA: Insufficient documentation

## 2022-09-29 DIAGNOSIS — M79605 Pain in left leg: Secondary | ICD-10-CM | POA: Insufficient documentation

## 2022-09-29 NOTE — Therapy (Signed)
[facility] [address]. [place], [place], [address] Phone: [phone]   Fax:  [phone]  Physical Therapy Treatment  Patient Details  Name: Mallory Simmons MRN: [id number] Date of Birth: 1976-08-22 Referring Provider (PT): Dayna Barker, MD  Encounter Date: 09/29/2022   PT End of Session - 09/29/22 1705     Visit Number 2    Number of Visits 16    Date for PT Re-Evaluation 11/16/22    Authorization Type [place] Time Period 09/21/22-12/14/2022    PT Start Time 1700    PT Stop Time 1745    PT Time Calculation (min) 45 min    Activity Tolerance Patient tolerated treatment well    Behavior During Therapy [facility] for tasks assessed/performed            Past Medical History:  Diagnosis Date   Depression    Thyroid disease    History reviewed. No pertinent surgical history.  There were no vitals filed for this visit.   Subjective Assessment -    Subjective Mallory Simmons finally stopped running 2 weeks ago, her hamstrings pain just wasn't getting better.    Pertinent History Mallory Simmons is a 43yoF who is referred by Dayna Barker MD to [facility] for a subacute hamstrings strain. Pt reports a finish line sprint at a 10k in October with acute hamstrings strain left. She was able to conitnue some running for a few weeks, but pain with running has not improve. Pt stopped running 11/15 in hopes to allow healing and start PT to address issue. Pt is not limited from doing ADL or IADL activity, but she does have increased soreness with prolonged sitting >49mn. Pt has conitnued to walk for cardio fitness workouts and conitnues to do resistance training with her trainer while avoiding exercises thought to be hamstrings speciifc (squats and romanian deadlifts). Pt seen by sports medicine who reports no notable findings with xray and MSUS. Pt is familiar to this clinic for other episodes of  care, seen here for running injury of Rt hip with good outcome and full return to sport.    How long can you sit comfortably? unlimited sitting tolerance, but notable achiness midbelly in 2 hours    How long can you stand comfortably? Unlimited    How long can you walk comfortably? Unlimited    Diagnostic tests Did xray in office; diagnositc ultrasound    Patient Stated Goals Return to running, personal training unrestricted.    Currently in Pain? No/denies             FROM INITIAL EVALUATION   [facility] PT Assessment -       Assessment   Medical Diagnosis Left hamstrings strain    Referring Provider (PT) HDayna Barker MD    Onset Date/Surgical Date 08/07/22    Prior Therapy PT here previously for other unrelated problems      Precautions   Precautions None      Restrictions   Weight Bearing Restrictions No      Balance Screen   Has the patient fallen in the past 6 months No    Has the patient had a decrease in activity level because of a fear of falling?  No    Is the patient reluctant to leave their home because of a fear of falling?  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[employer], hiking, Charity fundraiser at gym      Observation/Other Assessments   Focus on Therapeutic Outcomes (FOTO)  71      Sensation   Light Touch Not tested   pt denies involvement     Coordination   Gross Motor Movements are Fluid and Coordinated Yes      Flexibility   Soft Tissue Assessment /Muscle Length yes    Hamstrings Hip at 90*: Lt: -20; Rt: -30    10 degree differential; need to be careful given presence of bilat recurvatum           BLE Strength Assessment      Right  Left  Hip Flexion 5/5 5-/5  Hip Horizontal ABDCT  5/5 5/5  Hip Horizontal ADD 5/5 5/5  Hip IR (seated)  5/5 5/5  Hip ER (seated) 5/5 5/5  Knee Extension 5/5 5/5  Knee Flexion (seated) 5/5 5/5*  Ankle Dorsiflexion 5/5 5/5  Hip  ABDCT (supine)  5/5 5/5  Hip Extension (Supine @ 30 degrees) 5/5 4/5*  Hip Extension (Prone SLR) 5/5 4+/5*  Hip Extension (Prone BLR) 5/5 5/5  Knee flexion (prone at 45 degrees) 5/5 5/5*  Knee flexion (prone at 90 degrees) 5/5 4/5**  *pain **PAIN  TODAY'S TREATMENT  SUBJECTIVE: Pt reports that she is doing well today. She has noticed significant improvement in her L hamstring however pain still persists slightly. She has strained her L calf since the last therapy session and this has been limiting for her. She denies any resting pain upon arrival.    PAIN: No resting pain, pain with deep palpation of L hamstring and calf;   Ther-ex  Supine bridges with knees slightly extended past 90 degrees of flexion to target hamstrings x 10; Prone eccentric L hamstring curls x 10; Prone L ankle manually resistance plantarflexion 2 x 10; HEP updated and reviewed with patient;   Manual Therapy  Strength testing of L hamstring reveals persistent pain with maximal strength testing in prone; Pt continues to experience pain with deep palpation of L hamstring; Generalized pain with deep palpation of L calf, worse on the lateral aspect; Extensive STM to L hamstring and L calf; Proximal and distal L hamstring stretch with concurrent parallel hamstring separation;   PATIENT EDUCATION:  Education details: Pt educated throughout session about proper posture and technique with exercises. Improved exercise technique, movement at target joints, use of target muscles after min to mod verbal, visual, tactile cues. Prognosis and care for L hamstring and calf strain  Person educated: Patient Education method: Explanation Education comprehension: verbalized understanding   HOME EXERCISE PROGRAM:  Access Code: [email or web address] URL: [email or web address] Date: 09/29/2022 Prepared by: Roxana Hires  Exercises - Supine Bridge  - 1 x daily - 7 x weekly - 3 sets - 10 reps - Supine Hamstring Stretch  with Strap (Mirrored)  - 1 x daily - 7 x weekly - 3 sets - 10 reps - Hamstring Curl with Weight Machine  - 1 x daily - 7 x weekly - 2 sets - 10 reps - Gastroc Stretch on Wall  - 1 x daily - 7 x weekly - 3 sets - 10 reps - Soleus Stretch on Wall  - 1 x daily - 7 x weekly - 3 sets - 10 reps - Seated Heel Raise  - 1 x daily - 7 x weekly - 3 sets - 10 reps      PT Education -  Education Details typical stages for rehab for return to sport; how to modify intensity in resumption of moderate resistance loading exercises.    Person(s) Educated Patient    Methods Explanation;Demonstration;Handout;Verbal cues    Comprehension Verbalized understanding;Returned demonstration;Need further instruction              PT Short Term Goals -       PT SHORT TERM GOAL #1   Title Pt to report return to modified squat and RDL at gym with moderate loading that does not exacerbate pain.    Baseline eval: Pt has refrained from RDL and squat since injury    Time 4    Period Weeks    Status New    Target Date 10/19/22      PT SHORT TERM GOAL #2   Title Pt to tolerate MMT of seated Left knee flexion, prone knee flexion at 45 degrees without apprehension or giving way.    Baseline eval: unable to give maximal effort due to pain apprehension with load    Time 4    Period Weeks    Status New    Target Date 10/19/22      PT SHORT TERM GOAL #3   Title Pt to report absence of pain exacerbation from work-related sitting actiivty.    Baseline eval: pain after 30 minutes, but tolerates activity as needed    Time 4    Period Weeks    Status New    Target Date 10/19/22               PT Long Term Goals -       PT LONG TERM GOAL #1   Title Pt to improve FOTO survey score >5 points to indicated improved participation in meaningful occupations of fitness and leisure.    Baseline Eval: 71    Time 6    Period Weeks    Status New    Target Date 11/02/22      PT LONG TERM GOAL #2   Title Pt to  report success in walk/run return to run program with predictable and unconcerning soreness in her hamstrings area.    Baseline eval: has not return to any running yet    Time 6    Period Weeks    Status New    Target Date 11/02/22      PT LONG TERM GOAL #3   Title Pt to demonstrate 10RM or dynamometry of knee flexion and straight leg hip extension with less than 10% asymetry.    Baseline eval: not approrpaite for testing beyond MMT at this time    Time 8    Period Weeks    Status New    Target Date 11/16/22      PT LONG TERM GOAL #4   Title Pt to report return to running 15 miles weekly at self selected pace running 3x/week.    Baseline eval: not running    Time 8    Period Weeks    Status New    Target Date 11/16/22      PT LONG TERM GOAL #5   Title Pt to report tolerance to interval workouts of 4x427mor 8x2043mt 75-85% effort without antalgic gait and without >48 hours of post workout soreness >3/10 ot indicate improves sprot-specific load tolerance of hamstrings.    Baseline eval: has not run intervals in several months    Time 10    Period Weeks    Status New  Target Date 11/30/22               Plan -     Clinical Impression Statement Significant improvement in L hamstring pain at this time however pain persists with deep palpation and maximal resistance testing. Pt did strain her L calf since the initial evaluation and that has been painful. She is painful to palpation along a large portion of the mid-belly of her L calf however more on the lateral aspect. Pt encouraged to follow relative rest for her L calf strain. Education about gradual return to exercise within pain-free limitations. Once calf is healed OK to return to light pain-free jogging to assess hamstring tolerance. Updated HEP with patient and pt encouraged to follow-up as scheduled. Pt will benefit from PT services to address deficits in pain and strength in order to return to full function at home,  work, and with exercise.   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    PT Home Exercise Plan Access Code: [email or web address]  URL: [email or web address]  Date: 09/21/2022  Prepared by: Rebbeca Paul    Exercises  - Supine Bridge  - 1 x daily - 5 x weekly - 3 sets - 12 reps - 3 hold  - Hooklying Hamstring Stretch with Strap  - 1 x daily - 5 x weekly - 1 sets - 3 reps - 45 hold  - Hamstring Curl with Weight Machine  - 1 x daily - 3 x weekly - 3 sets - 15 reps - 1 hold    Consulted and Agree with Plan of Care Patient             Patient will benefit from skilled therapeutic intervention in order to improve the following deficits and impairments:     Visit Diagnosis: Muscle weakness (generalized)  Pain in left leg     Problem List Patient Active Problem List   Diagnosis Date Noted   Pain in left foot 04/06/2022   Valgus deformity of both great toes 04/06/2022   Essential hypertension 02/09/2022   Excessive bleeding in premenopausal period 10/02/2020   Acne 26/20/3559   Monoallelic mutation of ATM gene 06/19/2019   Restless legs 02/25/2017   Dysplastic nevus 12/30/2015   Hypertrophic scar 12/30/2015   Anxiety and depression  12/29/2015   Hypothyroidism 12/29/2015     Phillips Grout PT, DPT, GCS  Raenah Murley, PT 09/30/2022, 5:03 PM  [facility] [address] [place], [place], [address] Phone: [phone]   Fax:  [phone]

## 2022-10-06 ENCOUNTER — Ambulatory Visit: Payer: BC Managed Care – PPO

## 2022-10-06 DIAGNOSIS — M6281 Muscle weakness (generalized): Secondary | ICD-10-CM

## 2022-10-06 DIAGNOSIS — M79605 Pain in left leg: Secondary | ICD-10-CM

## 2022-10-06 NOTE — Therapy (Unsigned)
[facility] [address]. [place], [place], [address] Phone: [phone]   Fax:  [phone]  Physical Therapy Treatment  Patient Details  Name: Mallory Simmons MRN: [id number] Date of Birth: 03/11/1976 Referring Provider (PT): Dayna Barker, MD  Encounter Date: 10/06/2022   PT End of Session - 10/08/22 0933     Visit Number 3    Number of Visits 16    Date for PT Re-Evaluation 11/16/22    Authorization Type Sula Time Period 09/21/22-12/14/2022    PT Start Time 1619    PT Stop Time 1700    PT Time Calculation (min) 41 min    Activity Tolerance Patient tolerated treatment well    Behavior During Therapy [facility] for tasks assessed/performed            Past Medical History:  Diagnosis Date   Depression    Thyroid disease    History reviewed. No pertinent surgical history.  There were no vitals filed for this visit.   Subjective Assessment -    Subjective Mallory Simmons finally stopped running 2 weeks ago, her hamstrings pain just wasn't getting better.    Pertinent History Mallory Simmons is a 40yoF who is referred by Dayna Barker MD to [facility] for a subacute hamstrings strain. Pt reports a finish line sprint at a 10k in October with acute hamstrings strain left. She was able to conitnue some running for a few weeks, but pain with running has not improve. Pt stopped running 11/15 in hopes to allow healing and start PT to address issue. Pt is not limited from doing ADL or IADL activity, but she does have increased soreness with prolonged sitting >29mn. Pt has conitnued to walk for cardio fitness workouts and conitnues to do resistance training with her trainer while avoiding exercises thought to be hamstrings speciifc (squats and romanian deadlifts). Pt seen by sports medicine who reports no notable findings with xray and MSUS. Pt is familiar to this clinic for other episodes of  care, seen here for running injury of Rt hip with good outcome and full return to sport.    How long can you sit comfortably? unlimited sitting tolerance, but notable achiness midbelly in 2 hours    How long can you stand comfortably? Unlimited    How long can you walk comfortably? Unlimited    Diagnostic tests Did xray in office; diagnositc ultrasound    Patient Stated Goals Return to running, personal training unrestricted.    Currently in Pain? No/denies             FROM INITIAL EVALUATION   [facility] PT Assessment -       Assessment   Medical Diagnosis Left hamstrings strain    Referring Provider (PT) HDayna Barker MD    Onset Date/Surgical Date 08/07/22    Prior Therapy PT here previously for other unrelated problems      Precautions   Precautions None      Restrictions   Weight Bearing Restrictions No      Balance Screen   Has the patient fallen in the past 6 months No    Has the patient had a decrease in activity level because of a fear of falling?  No    Is the patient reluctant to leave their home because of a fear of falling?  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[employer], hiking, Charity fundraiser at gym      Observation/Other Assessments   Focus on Therapeutic Outcomes (FOTO)  71      Sensation   Light Touch Not tested   pt denies involvement     Coordination   Gross Motor Movements are Fluid and Coordinated Yes      Flexibility   Soft Tissue Assessment /Muscle Length yes    Hamstrings Hip at 90*: Lt: -20; Rt: -30    10 degree differential; need to be careful given presence of bilat recurvatum           BLE Strength Assessment      Right  Left  Hip Flexion 5/5 5-/5  Hip Horizontal ABDCT  5/5 5/5  Hip Horizontal ADD 5/5 5/5  Hip IR (seated)  5/5 5/5  Hip ER (seated) 5/5 5/5  Knee Extension 5/5 5/5  Knee Flexion (seated) 5/5 5/5*  Ankle Dorsiflexion 5/5 5/5  Hip  ABDCT (supine)  5/5 5/5  Hip Extension (Supine @ 30 degrees) 5/5 4/5*  Hip Extension (Prone SLR) 5/5 4+/5*  Hip Extension (Prone BLR) 5/5 5/5  Knee flexion (prone at 45 degrees) 5/5 5/5*  Knee flexion (prone at 90 degrees) 5/5 4/5**  *pain **PAIN    TODAY'S TREATMENT  SUBJECTIVE: Pt reports that she is doing well today. She has noticed significant improvement in her L calf however her hamstring occasionally hurts. She has not tried jogging yet. Denies any resting pain upon arrival.    PAIN: No resting pain, pain with deep palpation of L hamstring and calf;   Ther-ex  Prone eccentric L hamstring curls 2 x 10; Supine bridges with knees slightly extended past 90 degrees of flexion to target hamstrings x 10; Bridge march x 10; Swiss ball bridges x 10; Swiss ball hamstring curls 2 x 10; LLE single leg RDLs 2 x 10; HEP updated and reviewed with patient;   Manual Therapy  Pt continues to experience pain with deep palpation of L hamstring; Mild pain with deep palpation of L calf; Extensive STM to L hamstring (biceps femoris) and L calf (lateral head); Proximal and distal L hamstring stretch x 30s each;   Trigger Point Dry Needling (TDN), unbilled Education performed with patient regarding potential benefit of TDN. Reviewed precautions and risks with patient. Pt provided verbal consent to treatment. In prone TDN performed to L hamstring (biceps femoris) with 2, 0.30 x 60 single needle placements with local twitch response (LTR). Also performed TDN to lateral head of gastroc with 1, 0.30 x 60 single needle placement with LTR. Pistoning technique utilized.    PATIENT EDUCATION:  Education details: Pt educated throughout session about proper posture and technique with exercises. Improved exercise technique, movement at target joints, use of target muscles after min to mod verbal, visual, tactile cues. Prognosis and care for L hamstring and calf strain  Person educated:  Patient Education method: Explanation Education comprehension: verbalized understanding   HOME EXERCISE PROGRAM:  Access Code: [email or web address] URL: [email or web address] Date: 10/06/2022 Prepared by: Roxana Hires  Exercises - Supine Hamstring Stretch with Strap (Mirrored)  - 1 x daily - 7 x weekly - 2 sets - 10 reps - Hamstring Curl with Weight Machine  - 1 x daily - 7 x weekly - 2 sets - 10 reps - Gastroc Stretch on Wall  - 1 x daily - 7 x weekly - 3 sets - 10 reps - Soleus Stretch on  Wall  - 1 x daily - 7 x weekly - 3 sets - 10 reps - Seated Heel Raise  - 1 x daily - 7 x weekly - 3 sets - 10 reps - Single-Leg Benin Deadlift With Kettlebell  - 1 x daily - 7 x weekly - 2 sets - 10 reps - Marching Bridge  - 1 x daily - 7 x weekly - 3 sets - 10 reps     PT Education -    Education Details typical stages for rehab for return to sport; how to modify intensity in resumption of moderate resistance loading exercises.    Person(s) Educated Patient    Methods Explanation;Demonstration;Handout;Verbal cues    Comprehension Verbalized understanding;Returned demonstration;Need further instruction              PT Short Term Goals -       PT SHORT TERM GOAL #1   Title Pt to report return to modified squat and RDL at gym with moderate loading that does not exacerbate pain.    Baseline eval: Pt has refrained from RDL and squat since injury    Time 4    Period Weeks    Status New    Target Date 10/19/22      PT SHORT TERM GOAL #2   Title Pt to tolerate MMT of seated Left knee flexion, prone knee flexion at 45 degrees without apprehension or giving way.    Baseline eval: unable to give maximal effort due to pain apprehension with load    Time 4    Period Weeks    Status New    Target Date 10/19/22      PT SHORT TERM GOAL #3   Title Pt to report absence of pain exacerbation from work-related sitting actiivty.    Baseline eval: pain after 30 minutes, but tolerates  activity as needed    Time 4    Period Weeks    Status New    Target Date 10/19/22               PT Long Term Goals -       PT LONG TERM GOAL #1   Title Pt to improve FOTO survey score >5 points to indicated improved participation in meaningful occupations of fitness and leisure.    Baseline Eval: 71    Time 6    Period Weeks    Status New    Target Date 11/02/22      PT LONG TERM GOAL #2   Title Pt to report success in walk/run return to run program with predictable and unconcerning soreness in her hamstrings area.    Baseline eval: has not return to any running yet    Time 6    Period Weeks    Status New    Target Date 11/02/22      PT LONG TERM GOAL #3   Title Pt to demonstrate 10RM or dynamometry of knee flexion and straight leg hip extension with less than 10% asymetry.    Baseline eval: not approrpaite for testing beyond MMT at this time    Time 8    Period Weeks    Status New    Target Date 11/16/22      PT LONG TERM GOAL #4   Title Pt to report return to running 15 miles weekly at self selected pace running 3x/week.    Baseline eval: not running    Time 8    Period Weeks  Status New    Target Date 11/16/22      PT LONG TERM GOAL #5   Title Pt to report tolerance to interval workouts of 4x498mor 8x2074mt 75-85% effort without antalgic gait and without >48 hours of post workout soreness >3/10 ot indicate improves sprot-specific load tolerance of hamstrings.    Baseline eval: has not run intervals in several months    Time 10    Period Weeks    Status New    Target Date 11/30/22               Plan -     Clinical Impression Statement Significant improvement in L calf pain at this time however pain persists with deep palpation and resistance testing. Calf appears significantly better but she also still has pain with deep palpation. Progressed strengthening during session today and also utilized dry needling for both calf and hamstring. OK to  return to light pain-free jogging as tolerated. Recommend gradual return with jog/walk intervals. Avoid plyometrics at this time. Updated HEP with patient and pt encouraged to follow-up as scheduled. She will benefit from PT services to address deficits in pain and strength in order to return to full function at home, work, and with exercise.   Personal Factors and Comorbidities Age;Fitness;Behavior Pattern;Past/Current Experience;Time since onset of injury/illness/exacerbation;Education    Examination-Activity Limitations Sit;Squat    Examination-Participation Restrictions Occupation;Community Activity;Interpersonal Relationship;Yard Work    Stability/Clinical Decision Making Stable/Uncomplicated    ClDesigner, jewelleryow    Rehab Potential Excellent    PT Frequency 2x / week    PT Duration 12 weeks    PT Treatment/Interventions Gait training;Electrical Stimulation;Cryotherapy;Moist Heat;Therapeutic activities;Therapeutic exercise;Manual techniques;Scar mobilization    PT Next Visit Plan Discuss response to HEP, assess RDL and squat for load presciption, discuss a timeframe for intitaitng a walk/run return to running program.    PT Home Exercise Plan Access Code: [email or web address]: [email or web address]  Date: 09/21/2022  Prepared by: AlRebbeca Paul  Exercises  - Supine Bridge  - 1 x daily - 5 x weekly - 3 sets - 12 reps - 3 hold  - Hooklying Hamstring Stretch with Strap  - 1 x daily - 5 x weekly - 1 sets - 3 reps - 45 hold  - Hamstring Curl with Weight Machine  - 1 x daily - 3 x weekly - 3 sets - 15 reps - 1 hold    Consulted and Agree with Plan of Care Patient             Patient will benefit from skilled therapeutic intervention in order to improve the following deficits and impairments:     Visit Diagnosis: Muscle weakness (generalized)  Pain in left leg     Problem List Patient Active Problem List   Diagnosis Date Noted   Pain in left foot 04/06/2022   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

## 2022-10-12 NOTE — Therapy (Signed)
[facility] [address]. [place], [place], [address] Phone: [phone]   Fax:  [phone]  Physical Therapy Treatment  Patient Details  Name: Mallory Simmons MRN: [id number] Date of Birth: 08-30-1976 Referring Provider (PT): Dayna Barker, MD  Encounter Date: 10/13/2022    Past Medical History:  Diagnosis Date   Depression    Thyroid disease    No past surgical history on file.  There were no vitals filed for this visit.   Subjective Assessment -    Subjective Mallory finally stopped running 2 weeks ago, her hamstrings pain just wasn't getting better.    Pertinent History Ramiah Simmons is a 7yoF who is referred by Dayna Barker MD to [facility] Therapy for a subacute hamstrings strain. Pt reports a finish line sprint at a 10k in October with acute hamstrings strain left. She was able to conitnue some running for a few weeks, but pain with running has not improve. Pt stopped running 11/15 in hopes to allow healing and start PT to address issue. Pt is not limited from doing ADL or IADL activity, but she does have increased soreness with prolonged sitting >65mn. Pt has conitnued to walk for cardio fitness workouts and conitnues to do resistance training with her trainer while avoiding exercises thought to be hamstrings speciifc (squats and romanian deadlifts). Pt seen by sports medicine who reports no notable findings with xray and MSUS. Pt is familiar to this clinic for other episodes of care, seen here for running injury of Rt hip with good outcome and full return to sport.    How long can you sit comfortably? unlimited sitting tolerance, but notable achiness midbelly in 2 hours    How long can you stand comfortably? Unlimited    How long can you walk comfortably? Unlimited    Diagnostic tests Did xray in office; diagnositc ultrasound    Patient Stated Goals Return to running, personal training unrestricted.     Currently in Pain? No/denies             FROM INITIAL EVALUATION   [facility] PT Assessment -       Assessment   Medical Diagnosis Left hamstrings strain    Referring Provider (PT) HDayna Barker MD    Onset Date/Surgical Date 08/07/22    Prior Therapy PT here previously for other unrelated problems      Precautions   Precautions None      Restrictions   Weight Bearing Restrictions No      Balance Screen   Has the patient fallen in the past 6 months No    Has the patient had a decrease in activity level because of a fear of falling?  No    Is the patient reluctant to leave their home because of a fear of falling?  No      Prior Function   Level of Independence Independent    Vocation Full time employment   Librarian   Leisure Running, hiking, rCharity fundraiserat gym      Observation/Other Assessments   Focus on Therapeutic Outcomes (FOTO)  71      Sensation   Light Touch Not tested   pt denies involvement     Coordination   Gross Motor Movements are Fluid and Coordinated Yes      Flexibility   Soft Tissue Assessment /Muscle Length yes    Hamstrings Hip at 90*: Lt: -20; Rt: -30    10 degree differential;  need to be careful given presence of bilat recurvatum           BLE Strength Assessment      Right  Left  Hip Flexion 5/5 5-/5  Hip Horizontal ABDCT  5/5 5/5  Hip Horizontal ADD 5/5 5/5  Hip IR (seated)  5/5 5/5  Hip ER (seated) 5/5 5/5  Knee Extension 5/5 5/5  Knee Flexion (seated) 5/5 5/5*  Ankle Dorsiflexion 5/5 5/5  Hip ABDCT (supine)  5/5 5/5  Hip Extension (Supine @ 30 degrees) 5/5 4/5*  Hip Extension (Prone SLR) 5/5 4+/5*  Hip Extension (Prone BLR) 5/5 5/5  Knee flexion (prone at 45 degrees) 5/5 5/5*  Knee flexion (prone at 90 degrees) 5/5 4/5**  *pain **PAIN    TODAY'S TREATMENT  SUBJECTIVE: Pt reports that she is doing well today. She has noticed significant improvement in her L calf however her hamstring occasionally hurts. She has not  tried jogging yet. Denies any resting pain upon arrival.    PAIN: No resting pain, pain with deep palpation of L hamstring and calf;   Ther-ex  Prone eccentric L hamstring curls 2 x 10; Supine bridges with knees slightly extended past 90 degrees of flexion to target hamstrings x 10; Bridge march x 10; Swiss ball bridges x 10; Swiss ball hamstring curls 2 x 10; LLE single leg RDLs 2 x 10; HEP updated and reviewed with patient;   Manual Therapy  Pt continues to experience pain with deep palpation of L hamstring; Mild pain with deep palpation of L calf; Extensive STM to L hamstring (biceps femoris) and L calf (lateral head); Proximal and distal L hamstring stretch x 30s each;   Trigger Point Dry Needling (TDN), unbilled Education performed with patient regarding potential benefit of TDN. Reviewed precautions and risks with patient. Pt provided verbal consent to treatment. In prone TDN performed to L hamstring (biceps femoris) with 2, 0.30 x 60 single needle placements with local twitch response (LTR). Also performed TDN to lateral head of gastroc with 1, 0.30 x 60 single needle placement with LTR. Pistoning technique utilized.    PATIENT EDUCATION:  Education details: Pt educated throughout session about proper posture and technique with exercises. Improved exercise technique, movement at target joints, use of target muscles after min to mod verbal, visual, tactile cues. Prognosis and care for L hamstring and calf strain  Person educated: Patient Education method: Explanation Education comprehension: verbalized understanding   HOME EXERCISE PROGRAM:  Access Code: [email or web address] URL: [email or web address] Date: 10/06/2022 Prepared by: Roxana Hires  Exercises - Supine Hamstring Stretch with Strap (Mirrored)  - 1 x daily - 7 x weekly - 2 sets - 10 reps - Hamstring Curl with Weight Machine  - 1 x daily - 7 x weekly - 2 sets - 10 reps - Gastroc Stretch on Wall  - 1 x  daily - 7 x weekly - 3 sets - 10 reps - Soleus Stretch on Wall  - 1 x daily - 7 x weekly - 3 sets - 10 reps - Seated Heel Raise  - 1 x daily - 7 x weekly - 3 sets - 10 reps - Single-Leg Benin Deadlift With Kettlebell  - 1 x daily - 7 x weekly - 2 sets - 10 reps - Marching Bridge  - 1 x daily - 7 x weekly - 3 sets - 10 reps     PT Education -    Education Details typical stages for rehab for return to sport; how  to modify intensity in resumption of moderate resistance loading exercises.    Person(s) Educated Patient    Methods Explanation;Demonstration;Handout;Verbal cues    Comprehension Verbalized understanding;Returned demonstration;Need further instruction              PT Short Term Goals -       PT SHORT TERM GOAL #1   Title Pt to report return to modified squat and RDL at gym with moderate loading that does not exacerbate pain.    Baseline eval: Pt has refrained from RDL and squat since injury    Time 4    Period Weeks    Status New    Target Date 10/19/22      PT SHORT TERM GOAL #2   Title Pt to tolerate MMT of seated Left knee flexion, prone knee flexion at 45 degrees without apprehension or giving way.    Baseline eval: unable to give maximal effort due to pain apprehension with load    Time 4    Period Weeks    Status New    Target Date 10/19/22      PT SHORT TERM GOAL #3   Title Pt to report absence of pain exacerbation from work-related sitting actiivty.    Baseline eval: pain after 30 minutes, but tolerates activity as needed    Time 4    Period Weeks    Status New    Target Date 10/19/22               PT Long Term Goals -       PT LONG TERM GOAL #1   Title Pt to improve FOTO survey score >5 points to indicated improved participation in meaningful occupations of fitness and leisure.    Baseline Eval: 71    Time 6    Period Weeks    Status New    Target Date 11/02/22      PT LONG TERM GOAL #2   Title Pt to report success in walk/run  return to run program with predictable and unconcerning soreness in her hamstrings area.    Baseline eval: has not return to any running yet    Time 6    Period Weeks    Status New    Target Date 11/02/22      PT LONG TERM GOAL #3   Title Pt to demonstrate 10RM or dynamometry of knee flexion and straight leg hip extension with less than 10% asymetry.    Baseline eval: not approrpaite for testing beyond MMT at this time    Time 8    Period Weeks    Status New    Target Date 11/16/22      PT LONG TERM GOAL #4   Title Pt to report return to running 15 miles weekly at self selected pace running 3x/week.    Baseline eval: not running    Time 8    Period Weeks    Status New    Target Date 11/16/22      PT LONG TERM GOAL #5   Title Pt to report tolerance to interval workouts of 4x422mor 8x201mt 75-85% effort without antalgic gait and without >48 hours of post workout soreness >3/10 ot indicate improves sprot-specific load tolerance of hamstrings.    Baseline eval: has not run intervals in several months    Time 10    Period Weeks    Status New    Target Date 11/30/22  Plan -     Clinical Impression Statement Significant improvement in L calf pain at this time however pain persists with deep palpation and resistance testing. Calf appears significantly better but she also still has pain with deep palpation. Progressed strengthening during session today and also utilized dry needling for both calf and hamstring. OK to return to light pain-free jogging as tolerated. Recommend gradual return with jog/walk intervals. Avoid plyometrics at this time. Updated HEP with patient and pt encouraged to follow-up as scheduled. She will benefit from PT services to address deficits in pain and strength in order to return to full function at home, work, and with exercise.   Personal Factors and Comorbidities Age;Fitness;Behavior Pattern;Past/Current Experience;Time since onset of  injury/illness/exacerbation;Education    Examination-Activity Limitations Sit;Squat    Examination-Participation Restrictions Occupation;Community Activity;Interpersonal Relationship;Yard Work    Stability/Clinical Decision Making Stable/Uncomplicated    Designer, jewellery Low    Rehab Potential Excellent    PT Frequency 2x / week    PT Duration 12 weeks    PT Treatment/Interventions Gait training;Electrical Stimulation;Cryotherapy;Moist Heat;Therapeutic activities;Therapeutic exercise;Manual techniques;Scar mobilization    PT Next Visit Plan Discuss response to HEP, assess RDL and squat for load presciption, discuss a timeframe for intitaitng a walk/run return to running program.    PT Home Exercise Plan Access Code: [email or web address]  URL: [email or web address]  Date: 09/21/2022  Prepared by: Rebbeca Paul    Exercises  - Supine Bridge  - 1 x daily - 5 x weekly - 3 sets - 12 reps - 3 hold  - Hooklying Hamstring Stretch with Strap  - 1 x daily - 5 x weekly - 1 sets - 3 reps - 45 hold  - Hamstring Curl with Weight Machine  - 1 x daily - 3 x weekly - 3 sets - 15 reps - 1 hold    Consulted and Agree with Plan of Care Patient             Patient will benefit from skilled therapeutic intervention in order to improve the following deficits and impairments:     Visit Diagnosis: Muscle weakness (generalized)  Pain in left leg     Problem List Patient Active Problem List   Diagnosis Date Noted   Pain in left foot 04/06/2022   Valgus deformity of both great toes 04/06/2022   Essential hypertension 02/09/2022   Excessive bleeding in premenopausal period 10/02/2020   Acne 93/26/7124   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 [place], [place], [address] Phone: [phone]   Fax:  [phone]

## 2022-10-13 ENCOUNTER — Ambulatory Visit: Payer: BC Managed Care – PPO

## 2022-10-13 DIAGNOSIS — M6281 Muscle weakness (generalized): Secondary | ICD-10-CM

## 2022-10-13 DIAGNOSIS — M79605 Pain in left leg: Secondary | ICD-10-CM

## 2022-11-04 ENCOUNTER — Encounter: Payer: Self-pay | Admitting: Gastroenterology

## 2022-11-14 ENCOUNTER — Ambulatory Visit: Payer: BC Managed Care – PPO | Admitting: Anesthesiology

## 2022-11-14 ENCOUNTER — Ambulatory Visit
Admission: RE | Admit: 2022-11-14 | Discharge: 2022-11-14 | Disposition: A | Discharge status: Home / Self Care | Payer: BC Managed Care – PPO | Attending: Gastroenterology | Admitting: Gastroenterology

## 2022-11-14 ENCOUNTER — Other Ambulatory Visit: Payer: Self-pay

## 2022-11-14 ENCOUNTER — Encounter
Admission: RE | Disposition: A | Discharge status: Home / Self Care | Payer: Self-pay | Source: Home / Self Care | Attending: Gastroenterology

## 2022-11-14 DIAGNOSIS — F32A Depression, unspecified: Secondary | ICD-10-CM | POA: Diagnosis not present

## 2022-11-14 DIAGNOSIS — E039 Hypothyroidism, unspecified: Secondary | ICD-10-CM | POA: Insufficient documentation

## 2022-11-14 DIAGNOSIS — Z1211 Encounter for screening for malignant neoplasm of colon: Secondary | ICD-10-CM

## 2022-11-14 HISTORY — DX: Hypothyroidism, unspecified: E03.9

## 2022-11-14 HISTORY — PX: COLONOSCOPY WITH PROPOFOL: SHX5780

## 2022-11-14 LAB — POCT PREGNANCY, URINE: Preg Test, Ur: NEGATIVE

## 2022-11-14 SURGERY — COLONOSCOPY WITH PROPOFOL
Anesthesia: General

## 2022-11-14 MED ORDER — SODIUM CHLORIDE 0.9 % IV SOLN: INTRAVENOUS | Status: DC

## 2022-11-14 MED ORDER — LACTATED RINGERS IV SOLN: INTRAVENOUS | Status: DC

## 2022-11-14 MED ORDER — LIDOCAINE HCL (CARDIAC) PF 100 MG/5ML IV SOSY
PREFILLED_SYRINGE | INTRAVENOUS | Status: DC | PRN
  Administered 2022-11-14: 80 mg via INTRAVENOUS

## 2022-11-14 MED ORDER — PROPOFOL 10 MG/ML IV BOLUS
INTRAVENOUS | Status: DC | PRN
  Administered 2022-11-14: 40 mg via INTRAVENOUS
  Administered 2022-11-14: 30 mg via INTRAVENOUS
  Administered 2022-11-14: 90 mg via INTRAVENOUS
  Administered 2022-11-14 (×3): 20 mg via INTRAVENOUS

## 2022-11-14 MED ORDER — STERILE WATER FOR IRRIGATION IR SOLN: Status: DC | PRN

## 2022-11-14 MED ORDER — STERILE WATER FOR IRRIGATION IR SOLN
Status: DC | PRN
  Administered 2022-11-14: 1

## 2022-11-14 SURGICAL SUPPLY — 21 items

## 2022-11-14 NOTE — Anesthesia Postprocedure Evaluation (Signed)
Anesthesia Post Note  Patient: Mallory Simmons  Procedure(s) Performed: COLONOSCOPY WITH PROPOFOL  Patient location during evaluation: PACU Anesthesia Type: General Level of consciousness: awake and alert, oriented and patient cooperative Pain management: pain level controlled Vital Signs Assessment: post-procedure vital signs reviewed and stable Respiratory status: spontaneous breathing, nonlabored ventilation and respiratory function stable Cardiovascular status: blood pressure returned to baseline and stable Postop Assessment: adequate PO intake Anesthetic complications: no   No notable events documented.   Last Vitals:  Vitals:   11/14/22 0933 11/14/22 0941  BP: 107/63 126/84  Pulse: 77 85  Resp: 18 15  Temp: (!) 36.4 C 36.4 C  SpO2: 99% 100%    Last Pain:  Vitals:   11/14/22 0941  TempSrc:   PainSc: 0-No pain                 Darrin Nipper

## 2022-11-14 NOTE — Anesthesia Preprocedure Evaluation (Addendum)
Anesthesia Evaluation  Patient identified by MRN, date of birth, ID band Patient awake    Reviewed: Allergy & Precautions, NPO status , Patient's Chart, lab work & pertinent test results  History of Anesthesia Complications Negative for: history of anesthetic complications  Airway Mallampati: I   Neck ROM: Full    Dental no notable dental hx.    Pulmonary neg pulmonary ROS   Pulmonary exam normal breath sounds clear to auscultation       Cardiovascular Exercise Tolerance: Good negative cardio ROS Normal cardiovascular exam Rhythm:Regular Rate:Normal     Neuro/Psych  PSYCHIATRIC DISORDERS  Depression    negative neurological ROS     GI/Hepatic negative GI ROS,,,  Endo/Other  Hypothyroidism    Renal/GU negative Renal ROS     Musculoskeletal   Abdominal   Peds  Hematology negative hematology ROS (+)   Anesthesia Other Findings   Reproductive/Obstetrics                             Anesthesia Physical Anesthesia Plan  ASA: 2  Anesthesia Plan: General   Post-op Pain Management:    Induction: Intravenous  PONV Risk Score and Plan: 3 and Propofol infusion, TIVA and Treatment may vary due to age or medical condition  Airway Management Planned: Natural Airway  Additional Equipment:   Intra-op Plan:   Post-operative Plan:   Informed Consent: I have reviewed the patients History and Physical, chart, labs and discussed the procedure including the risks, benefits and alternatives for the proposed anesthesia with the patient or authorized representative who has indicated his/her understanding and acceptance.       Plan Discussed with: CRNA  Anesthesia Plan Comments: (LMA/GETA backup discussed.  Patient consented for risks of anesthesia including but not limited to:  - adverse reactions to medications - damage to eyes, teeth, lips or other oral mucosa - nerve damage due to  positioning  - sore throat or hoarseness - damage to heart, brain, nerves, lungs, other parts of body or loss of life  Informed patient about role of CRNA in peri- and intra-operative care.  Patient voiced understanding.)       Anesthesia Quick Evaluation

## 2022-11-14 NOTE — Transfer of Care (Signed)
Immediate Anesthesia Transfer of Care Note  Patient: Mallory Simmons  Procedure(s) Performed: COLONOSCOPY WITH PROPOFOL  Patient Location: PACU  Anesthesia Type: General  Level of Consciousness: awake, alert  and patient cooperative  Airway and Oxygen Therapy: Patient Spontanous Breathing and Patient connected to supplemental oxygen  Post-op Assessment: Post-op Vital signs reviewed, Patient's Cardiovascular Status Stable, Respiratory Function Stable, Patent Airway and No signs of Nausea or vomiting  Post-op Vital Signs: Reviewed and stable  Complications: No notable events documented.

## 2022-11-14 NOTE — Op Note (Addendum)
[facility] Patient Name: Mallory Simmons Procedure Date: 11/14/2022 9:11 AM MRN: [id number] Account #: [id number] Date of Birth: 1976/10/11 Admit Type: Outpatient Age: 47 Room: [facility] OR ROOM 01 Gender: Female Note Status: Finalized Instrument Name: [id number] Procedure:             Colonoscopy Indications:           Screening for colorectal malignant neoplasm Providers:             Lucilla Lame MD, MD Referring MD:          Ann Maki (Referring MD) Medicines:             Propofol per Anesthesia Complications:         No immediate complications. Procedure:             Pre-Anesthesia Assessment:                        - Prior to the procedure, a History and Physical was                         performed, and patient medications and allergies were                         reviewed. The patient's tolerance of previous                         anesthesia was also reviewed. The risks and benefits                         of the procedure and the sedation options and risks                         were discussed with the patient. All questions were                         answered, and informed consent was obtained. Prior                         Anticoagulants: The patient has taken no anticoagulant                         or antiplatelet agents. ASA Grade Assessment: II - A                         patient with mild systemic disease. After reviewing                         the risks and benefits, the patient was deemed in                         satisfactory condition to undergo the procedure.                        After obtaining informed consent, the colonoscope was                         passed under direct vision. Throughout the procedure,  the patient's blood pressure, pulse, and oxygen                         saturations were monitored continuously. The                         Colonoscope was introduced through the anus and                          advanced to the the cecum, identified by appendiceal                         orifice and ileocecal valve. The colonoscopy was                         performed without difficulty. The patient tolerated                         the procedure well. The quality of the bowel                         preparation was excellent. Findings:      The perianal and digital rectal examinations were normal.      The colon (entire examined portion) appeared normal. Impression:            - The entire examined colon is normal.                        - No specimens collected. Recommendation:        - Discharge patient to home.                        - Resume previous diet.                        - Continue present medications.                        - Repeat colonoscopy in 10 years for screening                         purposes. Procedure Code(s):     --- Professional ---                        [phone], Colonoscopy, flexible; diagnostic, including                         collection of specimen(s) by brushing or washing, when                         performed (separate procedure) Diagnosis Code(s):     --- Professional ---                        Z12.11, Encounter for screening for malignant neoplasm                         of colon CPT copyright 2022 American [employer] Association. All rights reserved. The codes documented in this report are preliminary and upon coder review may  be  revised to meet current compliance requirements. Lucilla Lame MD, MD 11/14/2022 9:30:03 AM This report has been signed electronically. Number of Addenda: 0 Note Initiated On: 11/14/2022 9:11 AM Scope Withdrawal Time: 0 hours 7 minutes 12 seconds  Total Procedure Duration: 0 hours 11 minutes 19 seconds  Estimated Blood Loss:  Estimated blood loss: none.      [facility]

## 2022-11-14 NOTE — H&P (Signed)
Lucilla Lame, MD [facility] [address] [place], [place] [address] Phone: [phone] Fax : [phone]  Primary Care Physician:  Sherre Scarlet, PA-C Primary Gastroenterologist:  Dr. Allen Norris  Pre-Procedure History & Physical: HPI:  Mallory Simmons is a 47 y.o. female is here for a screening colonoscopy.   Past Medical History:  Diagnosis Date   Depression    Hypothyroidism    Thyroid disease     History reviewed. No pertinent surgical history.  Prior to Admission medications   Medication Sig Start Date End Date Taking? Authorizing Provider  Cholecalciferol 125 MCG (5000 UT) TABS Take by mouth.   Yes [provider]  ferrous sulfate 325 (65 FE) MG tablet Take by mouth.   Yes [provider]  ibuprofen (ADVIL) 800 MG tablet Take by mouth. 02/25/22 02/25/23 Yes [provider]  lamoTRIgine (LAMICTAL) 150 MG tablet Take 150 mg by mouth daily.   Yes [provider]  levothyroxine (SYNTHROID) 112 MCG tablet Take by mouth. 08/16/22 08/16/23 Yes [provider]  Magnesium Oxide 400 MG CAPS  05/24/22  Yes [provider]  Multiple Vitamin (MULTIVITAMIN) tablet Take 1 tablet by mouth daily.   Yes [provider]  Omega-3 Fatty Acids (FISH OIL BURP-LESS) 1200 MG CAPS  06/06/22  Yes [provider]  buPROPion ER (WELLBUTRIN SR) 100 MG 12 hr tablet Take 1 tablet by mouth daily. Patient not taking: Reported on 11/04/2022 05/30/16   [provider]  metroNIDAZOLE (FLAGYL) 500 MG tablet Take 1 tablet (500 mg total) by mouth 2 (two) times daily. Patient not taking: Reported on 11/04/2022 08/12/22   Lyndee Hensen, DO  naproxen (NAPROSYN) 500 MG tablet Take 1 tablet (500 mg total) by mouth 2 (two) times daily with a meal. Patient not taking: Reported on 11/04/2022 10/23/17   Lorin Picket, PA-C  tranexamic acid (LYSTEDA) 650 MG TABS tablet TAKE 2 TABLETS (1,300 MG TOTAL) BY MOUTH THREE (3) TIMES A DAY FOR 5  DAYS. Patient not taking: Reported on 11/04/2022 02/01/22   [provider]    Allergies as of 09/19/2022   (No Known Allergies)    Family History  Problem Relation Age of Onset   Breast cancer Mother    Hypertension Mother    Prostate cancer Father     Social History   Socioeconomic History   Marital status: Divorced    Spouse name: Not on file   Number of children: Not on file   Years of education: Not on file   Highest education level: Not on file  Occupational History   Not on file  Tobacco Use   Smoking status: Never   Smokeless tobacco: Never  Vaping Use   Vaping Use: Never used  Substance and Sexual Activity   Alcohol use: Yes    Comment: social   Drug use: No   Sexual activity: Not on file  Other Topics Concern   Not on file  Social History Narrative   Not on file   Social Determinants of Health   Financial Resource Strain: Not on file  Food Insecurity: Not on file  Transportation Needs: Not on file  Physical Activity: Not on file  Stress: Not on file  Social Connections: Not on file  Intimate Partner Violence: Not on file    Review of Systems: See HPI, otherwise negative ROS  Physical Exam: Ht '5\' 7"'$  (1.702 m)   Wt 68 kg   LMP 10/13/2022   BMI 23.49 kg/m  General:   Alert,  pleasant and cooperative in NAD Head:  Normocephalic and atraumatic. Neck:  Supple; no masses or thyromegaly. Lungs:  Clear throughout to auscultation.    Heart:  Regular rate and rhythm. Abdomen:  Soft, nontender and nondistended. Normal bowel sounds, without guarding, and without rebound.   Neurologic:  Alert and  oriented x4;  grossly normal neurologically.  Impression/Plan: Mallory Simmons is now here to undergo a screening colonoscopy.  Risks, benefits, and alternatives regarding colonoscopy have been reviewed with the patient.  Questions have been answered.  All parties agreeable.

## 2022-11-16 ENCOUNTER — Encounter: Payer: Self-pay | Admitting: Gastroenterology

## 2023-05-05 ENCOUNTER — Ambulatory Visit: Payer: BC Managed Care – PPO | Attending: Family Medicine

## 2023-05-05 DIAGNOSIS — M25572 Pain in left ankle and joints of left foot: Secondary | ICD-10-CM | POA: Insufficient documentation

## 2023-05-05 NOTE — Therapy (Signed)
OUTPATIENT PHYSICAL THERAPY ANKLE/FOOT EVALUATION   Patient Name: BILEN GOLE MRN: [id number] DOB:[birth year], 47 y.o., female Today's Date: 05/05/2023  END OF SESSION:  PT End of Session - 05/05/23 1021     Visit Number 1    Number of Visits 1    Date for PT Re-Evaluation 05/12/23    Authorization Type eval: 05/05/23, [employer]    PT Start Time 1025    PT Stop Time 1055    PT Time Calculation (min) 30 min    Activity Tolerance Patient limited by pain    Behavior During Therapy [facility] for tasks assessed/performed            Past Medical History:  Diagnosis Date   Depression    Hypothyroidism    Thyroid disease    Past Surgical History:  Procedure Laterality Date   COLONOSCOPY WITH PROPOFOL N/A 11/14/2022   Procedure: COLONOSCOPY WITH PROPOFOL;  Surgeon: Midge Minium, MD;  Location: [facility];  Service: Endoscopy;  Laterality: N/A;   Patient Active Problem List   Diagnosis Date Noted   Encounter for screening colonoscopy 11/14/2022   Pain in left foot 04/06/2022   Valgus deformity of both great toes 04/06/2022   Essential hypertension 02/09/2022   Excessive bleeding in premenopausal period 10/02/2020   Acne 07/21/2020   Monoallelic mutation of ATM gene 40/98/1191   Restless legs 02/25/2017   Dysplastic nevus 12/30/2015   Hypertrophic scar 12/30/2015   Anxiety and depression 12/29/2015   Hypothyroidism 12/29/2015   PCP: Emilio Aspen PA-C  REFERRING PROVIDER: Neill Loft PA-C  REFERRING DIAG:  M67.90,M71.9 (ICD-10-CM) - Unspecified disorder of synovium, tendon, and bursa  M67.90 (ICD-10-CM) - Unspecified disorder of synovium and tendon, unspecified site   Rationale for Evaluation and Treatment: Rehabilitation  THERAPY DIAG: Pain in left ankle and joints of left foot  ONSET DATE: 04/14/23  FOLLOW-UP APPT SCHEDULED WITH REFERRING PROVIDER: No    SUBJECTIVE:                                                                                                                                                                                          SUBJECTIVE STATEMENT:  L foot pain  PERTINENT HISTORY:  04/28/23:  Pt complains of dorsal L foot pain which started approximately 3 weeks ago. She is an avid runner and ran a half marathon in May and did not have any pain during training or afterwards. She rested for several weeks after her race. Pt denies any trauma or specific inciting event that she can remember. She works out regularly with a Psychologist, educational and her typical exercise program includes  regular plyometric training but she has had to stop those due to an increase in pain. She denies any numbness/tingling or focal weakness in her foot. She reports mild swelling on the dorsal aspect of her foot but denies warmth or bruising. She has tried ibuprofen and ice without relief. Pain occurs in WB positions and most notably with walking. She is leaving for a work trip in [place] and is concerned about having to walk a lot while she is there. She typically wears supportive shoes and is aware of her need to cycle shoes with her frequent running. 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 She did not have any imaging of her L foot. She was referred to physical therapy for further management.   Old imaging of L foot from 04/06/22 XR FOOT LEFT 3 PLUS VIEWS  Impression:  Hallux valgus deformity with mild soft tissue swelling about the first MTP  joint.    PAIN:    Pain Intensity: Present: 0/10, Best: 0/10, Worst: 6/10 Pain location: L dorsal foot along mid shaft of 2nd and 3rd metatarsals with mild swelling.   Pain Quality: "extreme ache" Radiating: No  Swelling: Yes, pt reports some swelling. No bruising or warmth Numbness/Tingling: No Focal Weakness: No Aggravating factors: Worsens when walking extended  distances or walking without shoes. Tender to palpation. Only really painful in WB positions Relieving factors: No known relieving factors  24-hour pain behavior: Activity dependent History of prior back, hip, knee, or ankle injury, pain, surgery, or therapy: Yes, remote L hamstring injury and remote pain of L bunion, no history of L ankle/foot trauma.  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  Recent memory is intact.  Remote memory is intact.  Attention span and concentration are intact.  Expressive speech is intact.  Patient's fund of knowledge is within normal limits for educational level.    Gross Musculoskeletal Assessment Bulk: Normal Tone: Normal No trophic changes noted to foot/ankle. No ecchymosis or erythema but mild edema noted to dorsal aspect of L foot over mid/distal portion of 2nd and 3rd metatarsals  GAIT: Gait is antalgic with decreased progression to terminal stance of LLE and decreased R step length;  Posture: Full posture assessment deferred but no gross postural deficits observed that would be contributing to acute symptoms.  AROM Pt has full and painless AROM of L ankle and toes. Mild pain reported with composite passive flexion of digits 1-5;   LE MMT: MMT (out of 5) Right  Left   Hip flexion 5 5  Hip extension 5 5  Hip abduction 5 5  Hip adduction 5 5  Hip internal rotation 5 5  Hip external rotation 5 5  Knee flexion 5 5  Knee extension 5 5  Ankle dorsiflexion 5 5  Ankle plantarflexion Strong Strong  Ankle inversion 5 5   Ankle eversion 5 5  (* = pain; Blank rows = not tested)  Full strength and painless for resisted flexion and  extension of digits 1-5 as well as abduction of hallux;  Sensation Deferred  Reflexes Deferred  Muscle Length Deferred  Palpation Pt is exquisitely painful to palpation along mid-shaft of second and third metatarsals;   Passive Accessory Motion Pain with A/P mobilizations of metatarsals 2 and 3, normal mobility;  VASCULAR Deferred  SPECIAL TESTS Ligamentous Integrity Anterior Drawer (ATF, 10-15 plantarflexion with anterior translation): Not examined Talar Tilt (CFL, inversion): Negative Eversion Stress Test (Deltoid, eversion): Negative External Rotation Test (High ankle, dorsiflexion and external rotation): Negative Squeeze Test (High ankle): Not examined Impingment Sign (Dorsiflexion and eversion): Negative  Achilles Integrity Thompson Test: Negative  Fracture Screening Metatarsal Axial Loading: Positive for pain in 2nd and third metatarsals with axial loading; Vibration Test: Positive for mild pain in second metatarsal;  Pronation/Supination Navicular Drop: Not examined  Nerve Test Tarsal Tunnel Test (maximal DF, EV, toe ext with tapping over tarsal tunnel): Not examined Test for Morton's Neuroma (compress metatarsals and mobilize): Negative  Other Windlass Mechanism Test: Not examined   Beighton Scale Deferred   TODAY'S TREATMENT: Deferred   PATIENT EDUCATION:  Education details: Need for ortho visit for possible imaging, education about stress fractures and management Person educated: Patient Education method: Explanation Education comprehension: verbalized understanding   HOME EXERCISE PROGRAM:  None currently, discussed relative rest   ASSESSMENT:  CLINICAL IMPRESSION: Patient is a 47 y.o. female who was seen today for physical therapy evaluation and treatment for L foot pain. Findings consistent with likely second and possible  third metatarsal stress fracture. Pt advised to go to ortho walk-in care for consideration of imaging and boot given her upcoming trip out of the country and need to be able to ambulate while traveling.  OBJECTIVE IMPAIRMENTS: Abnormal gait and pain.   ACTIVITY LIMITATIONS:  walking  PARTICIPATION LIMITATIONS: shopping, community activity, and occupation  PERSONAL FACTORS: Time since onset of injury/illness/exacerbation are also affecting patient's functional outcome.   REHAB POTENTIAL: Excellent  CLINICAL DECISION MAKING: Stable/uncomplicated  EVALUATION COMPLEXITY: Low   GOALS: Goals reviewed with patient? Yes  LONG TERM GOALS:   Pt will be independent with self-management of L foot pain including relative rest and immobilization followed by progressive return to running/exercise. Baseline:  Goal status: ACHIEVED   PLAN: PT FREQUENCY: 1x/week  PT DURATION: 1 week  PLANNED INTERVENTIONS: Therapeutic exercises, Therapeutic activity, Neuromuscular re-education, Balance training, Gait training, Patient/Family education, Self Care, Joint mobilization, Joint manipulation, Vestibular training, Canalith repositioning, Orthotic/Fit training, DME instructions, Dry Needling, Electrical stimulation, Spinal manipulation, Spinal mobilization, Cryotherapy, Moist heat, Taping, Traction, Ultrasound, Ionotophoresis 4mg /ml Dexamethasone, Manual therapy, and Re-evaluation.  PLAN FOR NEXT SESSION: Discharge   Lynnea Maizes PT, DPT, GCS  Lazarius Rivkin, PT 05/05/2023, 11:46 AM

## 2023-05-05 NOTE — Addendum Note (Signed)
Addended by: Ria Comment D on: 05/05/2023 12:03 PM   Modules accepted: Orders

## 2023-05-29 ENCOUNTER — Ambulatory Visit: Payer: BC Managed Care – PPO

## 2024-01-23 NOTE — Therapy (Signed)
 OUTPATIENT PHYSICAL THERAPY ANKLE EVALUATION  Patient Name: Mallory Simmons MRN: [id number] DOB:Mar 11, 1976, 48 y.o., female Today's Date: 01/24/2024  END OF SESSION:  PT End of Session - 01/24/24 0804     Visit Number 1    Number of Visits 9    Date for PT Re-Evaluation 03/20/24    Authorization Type eval: 01/24/24    PT Start Time 0801    PT Stop Time 0845    PT Time Calculation (min) 44 min    Activity Tolerance Patient tolerated treatment well    Behavior During Therapy [facility] for tasks assessed/performed            Past Medical History:  Diagnosis Date   Depression    Hypothyroidism    Thyroid disease    Past Surgical History:  Procedure Laterality Date   COLONOSCOPY WITH PROPOFOL N/A 11/14/2022   Procedure: COLONOSCOPY WITH PROPOFOL;  Surgeon: Midge Minium, MD;  Location: [facility];  Service: Endoscopy;  Laterality: N/A;   Patient Active Problem List   Diagnosis Date Noted   Encounter for screening colonoscopy 11/14/2022   Pain in left foot 04/06/2022   Valgus deformity of both great toes 04/06/2022   Essential hypertension 02/09/2022   Excessive bleeding in premenopausal period 10/02/2020   Acne 07/21/2020   Monoallelic mutation of ATM gene 91/47/8295   Restless legs 02/25/2017   Dysplastic nevus 12/30/2015   Hypertrophic scar 12/30/2015   Anxiety and depression 12/29/2015   Hypothyroidism 12/29/2015    PCP: Emilio Aspen PA-C  REFERRING PROVIDER: Melonie Florida PA-C  REFERRING DIAG: [phone] (ICD-10-CM) - Unspecified injury of right foot, initial encounter   Rationale for Evaluation and Treatment: Rehabilitation  THERAPY DIAG: Pain in right ankle and joints of right foot  ONSET DATE: Approximately 9 months ago  FOLLOW-UP APPT SCHEDULED WITH REFERRING PROVIDER: No    SUBJECTIVE:                                                                                                                                                                                          SUBJECTIVE STATEMENT:  R plantar foot and medial ankle pain  PERTINENT HISTORY:  Pt reports experiencing chronic pain in her right second toe (both plantar and dorsal surface) for approximately nine months. She has previously been evaluated and was informed it might be a plantar plate tear or sprain. Pt has undergone significant rest and received a cortisone injection which provided temporary relief. She was also considering surgery (osteotomy) but eventually deferred for more conservative management. She is an active runner and the pain began sometime after participating in a half marathon in April  2024 (not immediately after the race). She notices that the pain increases when she increases her running mileage however there has been a notable improvement in the pain since she was referred to physical therapy. The more notable pain she has been experiencing recently is located on the plantar surface of her foot near her heel as well as just posterior to her medial malleolus. Because of this pain she feels as if her gait is altered and she has less stability/balance on her RLE. As the second toe pain improved she reports walking frequently barefoot around the house. When running she uses Brooks running shoes with an OTC orthotic (shoes are less than 6 months old, unsure of mileage on current shoes). She was encouraged to try a metatarsal pad but states that she feels like this worsened her pain.  PAIN:    Pain Intensity: Present: 0/10, Best: 0/10, Worst: 3/10 Pain location: R plantar heel and posterior to medial malleolus Pain Quality: Achy, "extreme soreness" Radiating: No  Swelling: No  Popping, catching, locking: No  Numbness/Tingling: No Focal Weakness: No Aggravating factors: Stairs (more ascending), hiking, running (later into mileage) Relieving factors: Rest 24-hour pain behavior: More pain in plantar heel first thing the morning, medial ankle pain with increase in  mileage. 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  Recent memory is intact.  Remote memory is intact.  Attention span and concentration are intact.  Expressive speech is intact.  Patient's fund of knowledge is within normal limits for educational level.    Gross Musculoskeletal Assessment Bulk: Normal Tone: Normal No trophic changes noted to foot/ankle. No ecchymosis, erythema, or edema noted. No gross ankle/foot deformity noted. No hammertoes. Hallux valgus noted bilaterally but pt denies any associated pain;  GAIT: No excessive pronation/supination noted during gait. She maintains a good arch in WB positions. Plan to perform running assessment at future visit.   Posture: Full posture assessment deferred but no gross changes noted that would impact current complaints. Single leg balance demonstrates more instability on RLE.  AROM AROM (Normal range in degrees) AROM   Hip Right Left  Flexion (125)    Extension (15)    Abduction (40)    Adduction     Internal Rotation (45)    External Rotation (45)        Knee    Flexion (135) WNL WNL  Extension (0) WNL WNL      Ankle    Dorsiflexion (20) Deferred Deferred  Plantarflexion (50) 68 66  Inversion (35) 40 44  Eversion (15) 14 16  (* =  pain; Blank rows = not tested)   LE MMT: MMT (out of 5) Right  Left   Hip flexion    Hip extension    Hip abduction 4+ 4+  Hip adduction 4+ 4+  Hip internal rotation 5 5  Hip  external rotation 5 5  Knee flexion 5 5  Knee extension 5 5  Ankle dorsiflexion 5 5  Ankle plantarflexion Strong Strong  Ankle inversion 5 5  Ankle eversion 5 5  (* = pain; Blank rows = not tested)  Sensation Deferred  Reflexes Deferred  Muscle Length Hamstrings: R: Not examined L: Not examined Quadriceps Michela Pitcher): R: Not examined L: Not examined Hip flexors Maisie Fus): R: Not examined L: Not examined IT band Claiborne Rigg): R: Not examined L: Not examined  Palpation Pt reports pain to palpation near insertion of plantar fascia on plantar heel. She also has pain with palpation along posterior tibialis tendon just posterior and superior to R medial malleolus. Mild pain with deep palpation on both the dorsal and plantar surface of the 2nd MTP joint.   Passive Accessory Motion Deferred testing ankle. Possible slight hypermobility with dorsal to plantar mobilization of 2nd MTP joint but no overt gross laxity.  VASCULAR Deferred  SPECIAL TESTS Ligamentous Integrity Anterior Drawer (ATF, 10-15 plantarflexion with anterior translation): Negative Talar Tilt (CFL, inversion): Negative Eversion Stress Test (Deltoid, eversion): Negative External Rotation Test (High ankle, dorsiflexion and external rotation): Negative Squeeze Test (High ankle): Negative Impingment Sign (Dorsiflexion and eversion): Negative  Achilles Integrity Thompson Test: Negative  Fracture Screening Metatarsal Axial Loading: Negative Tap/Percussion Test: Not performed Vibration Test: Not performed  Pronation/Supination Navicular Drop: Negative  Nerve Test Tarsal Tunnel Test (maximal DF, EV, toe ext with tapping over tarsal tunnel): Not examined Test for Morton's Neuroma (compress metatarsals and mobilize): Not  examined  Other Windlass Mechanism Test: Negative   Beighton Scale Deferred  TODAY'S TREATMENT: Education provided regarding plantar fasciitis and posterior tibial tendonitis. Educated pt on wearing shoes with adequate arch support at all times and to avoid barefoot walking temporarily until pain has improved.   PATIENT EDUCATION:  Education details: plantar fasciitis, posterior tibial tendonitis, footwear Person educated: Patient Education method: Explanation Education comprehension: verbalized understanding   HOME EXERCISE PROGRAM:    ASSESSMENT:  CLINICAL IMPRESSION: Patient is a 48 y.o. female who was seen today for physical therapy evaluation and treatment for R plantar foot pain and medial ankle pain. Findings are consistent with possible plantar fasciitis and posterior tibial tendonitis likely from alterations in gait related to metatarsalgia. In addition pt has been walking barefoot for prolonged periods at home which has likely contributed to these symptoms. In future sessions will discuss use of possible rigid carbon inserts as suggested by ortho for metatarsal pain in addition to taping techniques for second digit to avoid hyperextension. Will watch running form to look for any gross contributions to patients pain and introduce some strengthening and manual techniques to help with heel and medial ankle pain.  OBJECTIVE IMPAIRMENTS: pain.   ACTIVITY LIMITATIONS:  Running  PARTICIPATION LIMITATIONS:  Exercise  PERSONAL FACTORS: Time since onset of injury/illness/exacerbation and 1 comorbidity: anxiety  are also affecting patient's functional outcome.   REHAB POTENTIAL: Good  CLINICAL DECISION MAKING: Stable/uncomplicated  EVALUATION COMPLEXITY: Low   GOALS: Goals reviewed with patient? No  SHORT TERM GOALS: Target date: 02/21/2024  Pt will be independent with HEP to improve strength and decrease foot ankle pain to improve pain-free function at home, work, and  with running. Baseline:  Goal status: INITIAL   LONG TERM GOALS: Target date: 03/20/2024  Pt will increase FADI to at least 5% points to demonstrate significant improvement in function at home, work, and with running related to foot and ankle pain  Baseline: FADI Score: 95 /  104 or 91% Goal status: INITIAL  2.  Pt will report no further plantar foot and medial R ankle pain when running and afterwards in order to demonstrate clinically significant reduction in pain Baseline: worst: 3/10; Goal status: INITIAL  PLAN: PT FREQUENCY: 1x/week  PT DURATION: 8 weeks  PLANNED INTERVENTIONS: Therapeutic exercises, Therapeutic activity, Neuromuscular re-education, Balance training, Gait training, Patient/Family education, Self Care, Joint mobilization, Joint manipulation, Vestibular training, Canalith repositioning, Orthotic/Fit training, DME instructions, Dry Needling, Electrical stimulation, Spinal manipulation, Spinal mobilization, Cryotherapy, Moist heat, Taping, Traction, Ultrasound, Ionotophoresis 4mg /ml Dexamethasone, Manual therapy, and Re-evaluation.  PLAN FOR NEXT SESSION: Measure DF in WB, running assessment, taping techniques for 2nd toe, consider TDN/STM for posterior tibial tendon, introduce intrinsic and extrinsic foot strengthening   Sharalyn Ink Layla Kesling PT, DPT, GCS  Tanayia Wahlquist, PT 01/24/2024, 12:30 PM

## 2024-01-24 ENCOUNTER — Ambulatory Visit: Payer: Self-pay | Attending: Physician Assistant

## 2024-01-24 DIAGNOSIS — M25571 Pain in right ankle and joints of right foot: Secondary | ICD-10-CM | POA: Diagnosis present

## 2024-01-24 DIAGNOSIS — M6281 Muscle weakness (generalized): Secondary | ICD-10-CM | POA: Diagnosis present

## 2024-01-24 DIAGNOSIS — M25572 Pain in left ankle and joints of left foot: Secondary | ICD-10-CM

## 2024-01-28 NOTE — Therapy (Signed)
 OUTPATIENT PHYSICAL THERAPY ANKLE TREATMENT  Patient Name: Mallory Simmons MRN: [id number] DOB:January 08, 1976, 48 y.o., female Today's Date: 01/30/2024  END OF SESSION:  PT End of Session - 01/29/24 1020     Visit Number 2    Number of Visits 9    Date for PT Re-Evaluation 03/20/24    Authorization Type eval: 01/24/24    PT Start Time 1020    PT Stop Time 1100    PT Time Calculation (min) 40 min    Activity Tolerance Patient tolerated treatment well    Behavior During Therapy WFL for tasks assessed/performed            Past Medical History:  Diagnosis Date   Depression    Hypothyroidism    Thyroid disease    Past Surgical History:  Procedure Laterality Date   COLONOSCOPY WITH PROPOFOL N/A 11/14/2022   Procedure: COLONOSCOPY WITH PROPOFOL;  Surgeon: Midge Minium, MD;  Location: [facility];  Service: Endoscopy;  Laterality: N/A;   Patient Active Problem List   Diagnosis Date Noted   Encounter for screening colonoscopy 11/14/2022   Pain in left foot 04/06/2022   Valgus deformity of both great toes 04/06/2022   Essential hypertension 02/09/2022   Excessive bleeding in premenopausal period 10/02/2020   Acne 07/21/2020   Monoallelic mutation of ATM gene 11/91/4782   Restless legs 02/25/2017   Dysplastic nevus 12/30/2015   Hypertrophic scar 12/30/2015   Anxiety and depression 12/29/2015   Hypothyroidism 12/29/2015    PCP: Emilio Aspen PA-C  REFERRING PROVIDER: Melonie Florida PA-C  REFERRING DIAG: [phone] (ICD-10-CM) - Unspecified injury of right foot, initial encounter   Rationale for Evaluation and Treatment: Rehabilitation  THERAPY DIAG: Pain in right ankle and joints of right foot  ONSET DATE: Approximately 9 months ago  FOLLOW-UP APPT SCHEDULED WITH REFERRING PROVIDER: No   FROM INITIAL EVALUATION SUBJECTIVE:                                                                                                                                                                                          SUBJECTIVE STATEMENT:  R plantar foot and medial ankle pain  PERTINENT HISTORY:  Pt reports experiencing chronic pain in her right second toe (both plantar and dorsal surface) for approximately nine months. She has previously been evaluated and was informed it might be a plantar plate tear or sprain. Pt has undergone significant rest and received a cortisone injection which provided temporary relief. She was also considering surgery (osteotomy) but eventually deferred for more conservative management. She is an active runner and the pain began sometime after participating in a half marathon  in April 2024 (not immediately after the race). She notices that the pain increases when she increases her running mileage however there has been a notable improvement in the pain since she was referred to physical therapy. The more notable pain she has been experiencing recently is located on the plantar surface of her foot near her heel as well as just posterior to her medial malleolus. Because of this pain she feels as if her gait is altered and she has less stability/balance on her RLE. As the second toe pain improved she reports walking frequently barefoot around the house. When running she uses Brooks running shoes with an OTC orthotic (shoes are less than 6 months old, unsure of mileage on current shoes). She was encouraged to try a metatarsal pad but states that she feels like this worsened her pain.  PAIN:    Pain Intensity: Present: 0/10, Best: 0/10, Worst: 3/10 Pain location: R plantar heel and posterior to medial malleolus Pain Quality: Achy, "extreme soreness" Radiating: No  Swelling: No  Popping, catching, locking: No  Numbness/Tingling: No Focal Weakness: No Aggravating factors: Stairs (more ascending), hiking, running (later into mileage) Relieving factors: Rest 24-hour pain behavior: More pain in plantar heel first thing the morning, medial ankle pain  with increase in mileage. 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  Recent memory is intact.  Remote memory is intact.  Attention span and concentration are intact.  Expressive speech is intact.  Patient's fund of knowledge is within normal limits for educational level.    Gross Musculoskeletal Assessment Bulk: Normal Tone: Normal No trophic changes noted to foot/ankle. No ecchymosis, erythema, or edema noted. No gross ankle/foot deformity noted. No hammertoes. Hallux valgus noted bilaterally but pt denies any associated pain;  GAIT: No excessive pronation/supination noted during gait. She maintains a good arch in WB positions. Plan to perform running assessment at future visit.   Posture: Full posture assessment deferred but no gross changes noted that would impact current complaints. Single leg balance demonstrates more instability on RLE.  AROM AROM (Normal range in degrees) AROM   Hip Right Left  Flexion (125)    Extension (15)    Abduction (40)    Adduction     Internal Rotation (45)    External Rotation (45)        Knee    Flexion (135) WNL WNL  Extension (0) WNL WNL      Ankle    Dorsiflexion (20) Deferred Deferred  Plantarflexion (50) 68 66  Inversion (35) 40 44  Eversion  (15) 14 16  (* = pain; Blank rows = not tested)   LE MMT: MMT (out of 5) Right  Left   Hip flexion    Hip extension    Hip abduction 4+ 4+  Hip adduction 4+ 4+  Hip internal rotation 5 5  Hip external rotation 5 5  Knee flexion 5 5  Knee extension 5 5  Ankle dorsiflexion 5 5  Ankle plantarflexion Strong Strong  Ankle inversion 5 5  Ankle eversion 5 5  (* = pain; Blank rows = not tested)  Sensation Deferred  Reflexes Deferred  Muscle Length Hamstrings: R: Not examined L: Not examined Quadriceps Michela Pitcher): R: Not examined L: Not examined Hip flexors Maisie Fus): R: Not examined L: Not examined IT band Claiborne Rigg): R: Not examined L: Not examined  Palpation Pt reports pain to palpation near insertion of plantar fascia on plantar heel. She also has pain with palpation along posterior tibialis tendon just posterior and superior to R medial malleolus. Mild pain with deep palpation on both the dorsal and plantar surface of the 2nd MTP joint.   Passive Accessory Motion Deferred testing ankle. Possible slight hypermobility with dorsal to plantar mobilization of 2nd MTP joint but no overt gross laxity.  VASCULAR Deferred  SPECIAL TESTS Ligamentous Integrity Anterior Drawer (ATF, 10-15 plantarflexion with anterior translation): Negative Talar Tilt (CFL, inversion): Negative Eversion Stress Test (Deltoid, eversion): Negative External Rotation Test (High ankle, dorsiflexion and external rotation): Negative Squeeze Test (High ankle): Negative Impingment Sign (Dorsiflexion and eversion): Negative  Achilles Integrity Thompson Test: Negative  Fracture Screening Metatarsal Axial Loading: Negative Tap/Percussion Test: Not performed Vibration Test: Not performed  Pronation/Supination Navicular Drop: Negative  Nerve Test Tarsal Tunnel Test (maximal DF, EV, toe ext with tapping over tarsal tunnel): Not examined Test for Morton's Neuroma (compress metatarsals and mobilize): Not  examined  Other Windlass Mechanism Test: Negative   Beighton Scale Deferred   TODAY'S TREATMENT:   SUBJECTIVE: Pt reports that she is doing well today. No changes since the initial evaluation. She did run recently and experienced the same ankle pain she has been experiencing previously. No ankle pain upon arrival at rest today. No specific questions or concerns.    PAIN: Denies resting pain;   Ther-ex  Running assessment:  Walk/jog intervals for warm-up x 5 minutes Running on treadmill reveals cadence between 150-160 spm. She does run with a slighter wider stance and is a heel striker. Increased frontal plane pelvis motion but otherwise no gross issues noted to the eye. Plan to record in slow motion and assess at future appointment; Step down assessment with relatively good pelvic stability, perhaps slightly more knee valgus when in single leg stand on LLE; Supine R ankle inversion isometrics 5s hold x 10 (attempted eccentrics initially but increase in pain so regressed); Seated R towel scrunches for foot intrinsic strengthening x multiple bouts; HEP issued and reviewed;   Manual Therapy   Measured dorsiflexion in standing (WB): R: 32 degrees, L: 48 degrees  IASTM to plantarfascia and posterior tibialis tend as well as medial gastroc;   Trigger Point Dry Needling  Initial Treatment: Pt instructed on Dry Needling rational, procedures, and possible side effects. Pt instructed to expect mild to moderate muscle soreness later in the day and/or into the next day.  Pt instructed in methods to reduce muscle soreness. Pt instructed to continue prescribed HEP. Patient verbalized understanding of these instructions and education.   Patient Verbal Consent Given: Yes Education Handout Provided: No Pt has had dry needling during prior episodes with this therapist Muscles Treated: In supine using clean technique TDN performed to R tibialis posterior with 2, 0.16 x 30 single needle  placements with ache reported. Pistoning technique utilized.  Electrical Stimulation Performed: No Treatment Response/Outcome: Soreness    PATIENT EDUCATION:  Education details: Plan of care, HEP Person educated: Patient Education method: Explanation Education comprehension: verbalized understanding   HOME EXERCISE PROGRAM:  Access Code: [email or web address] URL: [email or web address] Date: 01/29/2024 Prepared by: Ria Comment  Exercises - Gastroc Stretch on Wall (Mirrored)  - 1-2 x daily - 7 x weekly - 3 sets - 10 reps - Soleus Stretch on Wall (Mirrored)  - 1-2 x daily - 7 x weekly - 3 sets - 10 reps - Towel Scrunches  - 1 x daily - 7 x weekly - 3 sets - 10 reps - Seated Calf Raise With Small Ball at Heels  - 1 x daily - 7 x weekly - 3 sets - 10 reps - Seated Ankle Inversion Eversion PROM (Mirrored)  - 1-2 x daily - 7 x weekly - 3 reps - 45-60s hold   ASSESSMENT:  CLINICAL IMPRESSION: Initiated strength exercises during session today with patient. Also performed IASTM to plantar fascia and TDN to posterior tibialis muscle. Performed running assessment with cadence of 150-160 steps per minute. Increase in frontal plane motion noted but single leg step down is relatively stable. Will perform slow motion assessment at future session. Issued HEP and reviewed with patient. Pt encouraged to follow-up as scheduled. Pt will benefit from PT services to address deficits in strength, balance, and mobility in order to return to full function at home and decrease his risk for falls.    OBJECTIVE IMPAIRMENTS: pain.   ACTIVITY LIMITATIONS:  Running  PARTICIPATION LIMITATIONS:  Exercise  PERSONAL FACTORS: Time since onset of injury/illness/exacerbation and 1 comorbidity: anxiety  are also affecting patient's functional outcome.   REHAB POTENTIAL: Good  CLINICAL DECISION MAKING: Stable/uncomplicated  EVALUATION COMPLEXITY: Low   GOALS: Goals reviewed with patient? No  SHORT TERM  GOALS: Target date: 02/21/2024  Pt will be independent with HEP to improve strength and decrease foot ankle pain to improve pain-free function at home, work, and with running. Baseline:  Goal status: INITIAL   LONG TERM GOALS: Target date: 03/20/2024  Pt will increase FADI to at least 5% points to demonstrate significant improvement in function at home, work, and with running related to foot and ankle pain  Baseline: FADI Score: 95 / 104 or 91% Goal status: INITIAL  2.  Pt will report no further plantar foot and medial R ankle pain when running and afterwards in order to demonstrate clinically significant reduction in pain Baseline: worst: 3/10; Goal status: INITIAL  PLAN: PT FREQUENCY: 1x/week  PT DURATION: 8 weeks  PLANNED INTERVENTIONS: Therapeutic exercises, Therapeutic activity, Neuromuscular re-education, Balance training, Gait training, Patient/Family education, Self Care, Joint mobilization, Joint manipulation, Vestibular training, Canalith repositioning, Orthotic/Fit training, DME instructions, Dry Needling, Electrical stimulation, Spinal manipulation, Spinal mobilization, Cryotherapy, Moist heat, Taping, Traction, Ultrasound, Ionotophoresis 4mg /ml Dexamethasone, Manual therapy, and Re-evaluation.  PLAN FOR NEXT SESSION: slow motion running assessment, assess response to TDN/STM for posterior tibial tendon, progress intrinsic and extrinsic foot strengthening   Sharalyn Ink Amaziah Ghosh PT, DPT, GCS  Imani Fiebelkorn, PT 01/30/2024, 1:07 PM

## 2024-01-29 ENCOUNTER — Ambulatory Visit: Payer: Self-pay

## 2024-01-29 DIAGNOSIS — M25571 Pain in right ankle and joints of right foot: Secondary | ICD-10-CM

## 2024-02-06 NOTE — Therapy (Unsigned)
 OUTPATIENT PHYSICAL THERAPY ANKLE TREATMENT  Patient Name: Mallory Simmons MRN: [id number] DOB:Jun 27, 1976, 48 y.o., female Today's Date: 02/08/2024  END OF SESSION:  PT End of Session - 02/07/24 1000     Visit Number 3    Number of Visits 9    Date for PT Re-Evaluation 03/20/24    Authorization Type eval: 01/24/24    PT Start Time 0800    PT Stop Time 0845    PT Time Calculation (min) 45 min    Activity Tolerance Patient tolerated treatment well    Behavior During Therapy [facility] for tasks assessed/performed            Past Medical History:  Diagnosis Date   Depression    Hypothyroidism    Thyroid disease    Past Surgical History:  Procedure Laterality Date   COLONOSCOPY WITH PROPOFOL N/A 11/14/2022   Procedure: COLONOSCOPY WITH PROPOFOL;  Surgeon: Midge Minium, MD;  Location: [facility];  Service: Endoscopy;  Laterality: N/A;   Patient Active Problem List   Diagnosis Date Noted   Encounter for screening colonoscopy 11/14/2022   Pain in left foot 04/06/2022   Valgus deformity of both great toes 04/06/2022   Essential hypertension 02/09/2022   Excessive bleeding in premenopausal period 10/02/2020   Acne 07/21/2020   Monoallelic mutation of ATM gene 40/98/1191   Restless legs 02/25/2017   Dysplastic nevus 12/30/2015   Hypertrophic scar 12/30/2015   Anxiety and depression 12/29/2015   Hypothyroidism 12/29/2015    PCP: Emilio Aspen PA-C  REFERRING PROVIDER: Melonie Florida PA-C  REFERRING DIAG: [id number] (ICD-10-CM) - Unspecified injury of right foot, initial encounter   Rationale for Evaluation and Treatment: Rehabilitation  THERAPY DIAG: Pain in right ankle and joints of right foot  Muscle weakness (generalized)  ONSET DATE: Approximately 9 months ago  FOLLOW-UP APPT SCHEDULED WITH REFERRING PROVIDER: No   FROM INITIAL EVALUATION SUBJECTIVE:                                                                                                                                                                                          SUBJECTIVE STATEMENT:  R plantar foot and medial ankle pain  PERTINENT HISTORY:  Pt reports experiencing chronic pain in her right second toe (both plantar and dorsal surface) for approximately nine months. She has previously been evaluated and was informed it might be a plantar plate tear or sprain. Pt has undergone significant rest and received a cortisone injection which provided temporary relief. She was also considering surgery (osteotomy) but eventually deferred for more conservative management. She is an active runner and the pain began sometime after participating  in a half marathon in April 2024 (not immediately after the race). She notices that the pain increases when she increases her running mileage however there has been a notable improvement in the pain since she was referred to physical therapy. The more notable pain she has been experiencing recently is located on the plantar surface of her foot near her heel as well as just posterior to her medial malleolus. Because of this pain she feels as if her gait is altered and she has less stability/balance on her RLE. As the second toe pain improved she reports walking frequently barefoot around the house. When running she uses Brooks running shoes with an OTC orthotic (shoes are less than 6 months old, unsure of mileage on current shoes). She was encouraged to try a metatarsal pad but states that she feels like this worsened her pain.  PAIN:    Pain Intensity: Present: 0/10, Best: 0/10, Worst: 3/10 Pain location: R plantar heel and posterior to medial malleolus Pain Quality: Achy, "extreme soreness" Radiating: No  Swelling: No  Popping, catching, locking: No  Numbness/Tingling: No Focal Weakness: No Aggravating factors: Stairs (more ascending), hiking, running (later into mileage) Relieving factors: Rest 24-hour pain behavior: More pain in plantar heel first thing  the morning, medial ankle pain with increase in mileage. 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  Recent memory is intact.  Remote memory is intact.  Attention span and concentration are intact.  Expressive speech is intact.  Patient's fund of knowledge is within normal limits for educational level.    Gross Musculoskeletal Assessment Bulk: Normal Tone: Normal No trophic changes noted to foot/ankle. No ecchymosis, erythema, or edema noted. No gross ankle/foot deformity noted. No hammertoes. Hallux valgus noted bilaterally but pt denies any associated pain;  GAIT: No excessive pronation/supination noted during gait. She maintains a good arch in WB positions. Plan to perform running assessment at future visit.   Posture: Full posture assessment deferred but no gross changes noted that would impact current complaints. Single leg balance demonstrates more instability on RLE.  AROM AROM (Normal range in degrees) AROM   Hip Right Left  Flexion (125)    Extension (15)    Abduction (40)    Adduction     Internal Rotation (45)    External Rotation (45)        Knee    Flexion (135) WNL WNL  Extension (0) WNL WNL      Ankle    Dorsiflexion (20) Deferred Deferred  Plantarflexion (50) 68 66   Inversion (35) 40 44  Eversion (15) 14 16  (* = pain; Blank rows = not tested)   LE MMT: MMT (out of 5) Right  Left   Hip flexion    Hip extension    Hip abduction 4+ 4+  Hip adduction 4+ 4+  Hip  internal rotation 5 5  Hip external rotation 5 5  Knee flexion 5 5  Knee extension 5 5  Ankle dorsiflexion 5 5  Ankle plantarflexion Strong Strong  Ankle inversion 5 5  Ankle eversion 5 5  (* = pain; Blank rows = not tested)  Sensation Deferred  Reflexes Deferred  Muscle Length Hamstrings: R: Not examined L: Not examined Quadriceps Amalia Badder): R: Not examined L: Not examined Hip flexors Andy Bannister): R: Not examined L: Not examined IT band Asencion Lawless): R: Not examined L: Not examined  Palpation Pt reports pain to palpation near insertion of plantar fascia on plantar heel. She also has pain with palpation along posterior tibialis tendon just posterior and superior to R medial malleolus. Mild pain with deep palpation on both the dorsal and plantar surface of the 2nd MTP joint.   Passive Accessory Motion Deferred testing ankle. Possible slight hypermobility with dorsal to plantar mobilization of 2nd MTP joint but no overt gross laxity.  VASCULAR Deferred  SPECIAL TESTS Ligamentous Integrity Anterior Drawer (ATF, 10-15 plantarflexion with anterior translation): Negative Talar Tilt (CFL, inversion): Negative Eversion Stress Test (Deltoid, eversion): Negative External Rotation Test (High ankle, dorsiflexion and external rotation): Negative Squeeze Test (High ankle): Negative Impingment Sign (Dorsiflexion and eversion): Negative  Achilles Integrity Thompson Test: Negative  Fracture Screening Metatarsal Axial Loading: Negative Tap/Percussion Test: Not performed Vibration Test: Not performed  Pronation/Supination Navicular Drop: Negative  Nerve Test Tarsal Tunnel Test (maximal DF, EV, toe ext with tapping over tarsal tunnel): Not examined Test for Morton's Neuroma (compress  metatarsals and mobilize): Not examined  Other Windlass Mechanism Test: Negative   Beighton Scale Deferred   TODAY'S TREATMENT:   SUBJECTIVE: Pt reports that she is doing well today. No changes since the last therapy session. She did run 4 miles recently and noticed onset of pain at similar distance. She is also reporting new onset of some bilateral anterior shin pain. No ankle pain upon arrival at rest today. No specific questions or concerns.    PAIN: Denies resting pain;   Ther-ex  Slow Motion Running assessment:  Walk/jog intervals for warm-up x 5 minutes Running on treadmill reveals increased frontal plane pelvis motion especially in when in L single leg stance. Excessive vertical displacement also noted. Supine R ankle inversion isometrics 5s hold x 3, progressed to eccentrics x 10; Seated figure 4 R ankle inversion strengthening with green tband x 10 (added to HEP) HEP updatedand reviewed;   Manual Therapy   IASTM to R posterior tibialis muscle/tendon and medial gastroc as well as bilateral anterior tibilialis muscle; Attempted R ankle eversion stretch but no stretch localized along posterior tibialis tendon so discontinued;   Trigger Point Dry Needling, unbilled Initial Treatment: Pt instructed on Dry Needling rational, procedures, and possible side effects. Pt instructed to expect mild to moderate muscle soreness later in the day and/or into the next day.  Pt instructed in methods to reduce muscle soreness. Pt instructed to continue prescribed HEP. Patient verbalized understanding of these instructions and education.   Patient Verbal Consent Given: Yes Education Handout Provided: No Pt has had dry needling during prior episodes with this therapist Muscles Treated: In supine using clean technique TDN performed to R tibialis posterior with 2, 0.25 x 40 single needle placements with ache reported. Pistoning technique utilized.  Electrical Stimulation Performed:  No Treatment Response/Outcome: Soreness  Not performed: Seated R towel scrunches for foot intrinsic strengthening x multiple bouts;    PATIENT EDUCATION:  Education details: Plan of care,  updated HEP Person educated: Patient Education method: Explanation, Demonstration, Verbal cues, and Handouts Education comprehension: verbalized understanding and returned demonstration   HOME EXERCISE PROGRAM:  Access Code: [email or web address] URL: [email or web address] Date: 02/07/2024 Prepared by: Crawford Dock  Exercises - Gastroc Stretch on Wall (Mirrored)  - 1-2 x daily - 7 x weekly - 3 sets - 10 reps - Soleus Stretch on Wall (Mirrored)  - 1-2 x daily - 7 x weekly - 3 sets - 10 reps - Towel Scrunches  - 1 x daily - 7 x weekly - 3 sets - 10 reps - Seated Calf Raise With Small Ball at Heels  - 1 x daily - 7 x weekly - 3 sets - 10 reps - Seated Ankle Inversion Eversion PROM (Mirrored)  - 1-2 x daily - 7 x weekly - 3 reps - 45-60s hold - Seated Figure 4 Ankle Inversion with Resistance  - 1 x daily - 3 x weekly - 3 sets - 5-10 reps   ASSESSMENT:  CLINICAL IMPRESSION: Progressed strengthening exercises during session today by performing eccentric inversion. Pt is also able to perform isotonic inversion strengthening in seated figure 4 position with green tband. Progressed IASTM to R posterior tibialis and bilateral anterior tibialis muscles. Repeated TDN to R posterior tibialis muscle. Performed slow motion running assessment which reveals increased frontal plane pelvis motion especially in when in L single leg stance. Excessive vertical displacement also noted. Updated HEP and reviewed with patient. Pt encouraged to follow-up as scheduled. Pt will benefit from PT services to address deficits in strength, balance, and mobility in order to return to full function at home and decrease his risk for falls.    OBJECTIVE IMPAIRMENTS: pain.   ACTIVITY LIMITATIONS:  Running  PARTICIPATION  LIMITATIONS:  Exercise  PERSONAL FACTORS: Time since onset of injury/illness/exacerbation and 1 comorbidity: anxiety  are also affecting patient's functional outcome.   REHAB POTENTIAL: Good  CLINICAL DECISION MAKING: Stable/uncomplicated  EVALUATION COMPLEXITY: Low   GOALS: Goals reviewed with patient? No  SHORT TERM GOALS: Target date: 02/21/2024  Pt will be independent with HEP to improve strength and decrease foot ankle pain to improve pain-free function at home, work, and with running. Baseline:  Goal status: INITIAL   LONG TERM GOALS: Target date: 03/20/2024  Pt will increase FADI to at least 5% points to demonstrate significant improvement in function at home, work, and with running related to foot and ankle pain  Baseline: FADI Score: 95 / 104 or 91% Goal status: INITIAL  2.  Pt will report no further plantar foot and medial R ankle pain when running and afterwards in order to demonstrate clinically significant reduction in pain Baseline: worst: 3/10; Goal status: INITIAL  PLAN: PT FREQUENCY: 1x/week  PT DURATION: 8 weeks  PLANNED INTERVENTIONS: Therapeutic exercises, Therapeutic activity, Neuromuscular re-education, Balance training, Gait training, Patient/Family education, Self Care, Joint mobilization, Joint manipulation, Vestibular training, Canalith repositioning, Orthotic/Fit training, DME instructions, Dry Needling, Electrical stimulation, Spinal manipulation, Spinal mobilization, Cryotherapy, Moist heat, Taping, Traction, Ultrasound, Ionotophoresis 4mg /ml Dexamethasone, Manual therapy, and Re-evaluation.  PLAN FOR NEXT SESSION: assess response to TDN/STM for posterior tibial tendon, progress intrinsic and extrinsic foot strengthening   Sherill Ding Coralee Edberg PT, DPT, GCS  Anden Bartolo, PT 02/08/2024, 10:46 AM

## 2024-02-07 ENCOUNTER — Ambulatory Visit: Payer: Self-pay

## 2024-02-07 DIAGNOSIS — M25571 Pain in right ankle and joints of right foot: Secondary | ICD-10-CM

## 2024-02-07 DIAGNOSIS — M6281 Muscle weakness (generalized): Secondary | ICD-10-CM

## 2024-02-13 NOTE — Therapy (Signed)
 OUTPATIENT PHYSICAL THERAPY ANKLE TREATMENT  Patient Name: Mallory Simmons MRN: [id number] DOB:02/23/1976, 48 y.o., female Today's Date: 02/14/2024  END OF SESSION:  PT End of Session - 02/14/24 0804     Visit Number 4    Number of Visits 9    Date for PT Re-Evaluation 03/20/24    Authorization Type eval: 01/24/24    PT Start Time 0805    PT Stop Time 0850    PT Time Calculation (min) 45 min    Activity Tolerance Patient tolerated treatment well    Behavior During Therapy [facility] for tasks assessed/performed            Past Medical History:  Diagnosis Date   Depression    Hypothyroidism    Thyroid disease    Past Surgical History:  Procedure Laterality Date   COLONOSCOPY WITH PROPOFOL  N/A 11/14/2022   Procedure: COLONOSCOPY WITH PROPOFOL ;  Surgeon: Marnee Sink, MD;  Location: [facility];  Service: Endoscopy;  Laterality: N/A;   Patient Active Problem List   Diagnosis Date Noted   Encounter for screening colonoscopy 11/14/2022   Pain in left foot 04/06/2022   Valgus deformity of both great toes 04/06/2022   Essential hypertension 02/09/2022   Excessive bleeding in premenopausal period 10/02/2020   Acne 07/21/2020   Monoallelic mutation of ATM gene 40/98/1191   Restless legs 02/25/2017   Dysplastic nevus 12/30/2015   Hypertrophic scar 12/30/2015   Anxiety and depression 12/29/2015   Hypothyroidism 12/29/2015    PCP: Julius Ohs PA-C  REFERRING PROVIDER: Zettie Hillock PA-C  REFERRING DIAG: [phone] (ICD-10-CM) - Unspecified injury of right foot, initial encounter   Rationale for Evaluation and Treatment: Rehabilitation  THERAPY DIAG: Pain in right ankle and joints of right foot  Muscle weakness (generalized)  ONSET DATE: Approximately 9 months ago  FOLLOW-UP APPT SCHEDULED WITH REFERRING PROVIDER: No   FROM INITIAL EVALUATION SUBJECTIVE:                                                                                                                                                                                          SUBJECTIVE STATEMENT:  R plantar foot and medial ankle pain  PERTINENT HISTORY:  Pt reports experiencing chronic pain in her right second toe (both plantar and dorsal surface) for approximately nine months. She has previously been evaluated and was informed it might be a plantar plate tear or sprain. Pt has undergone significant rest and received a cortisone injection which provided temporary relief. She was also considering surgery (osteotomy) but eventually deferred for more conservative management. She is an active runner and the pain began sometime after participating  in a half marathon in April 2024 (not immediately after the race). She notices that the pain increases when she increases her running mileage however there has been a notable improvement in the pain since she was referred to physical therapy. The more notable pain she has been experiencing recently is located on the plantar surface of her foot near her heel as well as just posterior to her medial malleolus. Because of this pain she feels as if her gait is altered and she has less stability/balance on her RLE. As the second toe pain improved she reports walking frequently barefoot around the house. When running she uses Brooks running shoes with an OTC orthotic (shoes are less than 6 months old, unsure of mileage on current shoes). She was encouraged to try a metatarsal pad but states that she feels like this worsened her pain.  PAIN:    Pain Intensity: Present: 0/10, Best: 0/10, Worst: 3/10 Pain location: R plantar heel and posterior to medial malleolus Pain Quality: Achy, "extreme soreness" Radiating: No  Swelling: No  Popping, catching, locking: No  Numbness/Tingling: No Focal Weakness: No Aggravating factors: Stairs (more ascending), hiking, running (later into mileage) Relieving factors: Rest 24-hour pain behavior: More pain in plantar heel first thing  the morning, medial ankle pain with increase in mileage. 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  Recent memory is intact.  Remote memory is intact.  Attention span and concentration are intact.  Expressive speech is intact.  Patient's fund of knowledge is within normal limits for educational level.    Gross Musculoskeletal Assessment Bulk: Normal Tone: Normal No trophic changes noted to foot/ankle. No ecchymosis, erythema, or edema noted. No gross ankle/foot deformity noted. No hammertoes. Hallux valgus noted bilaterally but pt denies any associated pain;  GAIT: No excessive pronation/supination noted during gait. She maintains a good arch in WB positions. Plan to perform running assessment at future visit.   Posture: Full posture assessment deferred but no gross changes noted that would impact current complaints. Single leg balance demonstrates more instability on RLE.  AROM AROM (Normal range in degrees) AROM   Hip Right Left  Flexion (125)    Extension (15)    Abduction (40)    Adduction     Internal Rotation (45)    External Rotation (45)        Knee    Flexion (135) WNL WNL  Extension (0) WNL WNL      Ankle    Dorsiflexion (20) Deferred Deferred  Plantarflexion (50) 68 66   Inversion (35) 40 44  Eversion (15) 14 16  (* = pain; Blank rows = not tested)   LE MMT: MMT (out of 5) Right  Left   Hip flexion    Hip extension    Hip abduction 4+ 4+  Hip adduction 4+ 4+  Hip  internal rotation 5 5  Hip external rotation 5 5  Knee flexion 5 5  Knee extension 5 5  Ankle dorsiflexion 5 5  Ankle plantarflexion Strong Strong  Ankle inversion 5 5  Ankle eversion 5 5  (* = pain; Blank rows = not tested)  Sensation Deferred  Reflexes Deferred  Muscle Length Hamstrings: R: Not examined L: Not examined Quadriceps Amalia Badder): R: Not examined L: Not examined Hip flexors Andy Bannister): R: Not examined L: Not examined IT band Asencion Lawless): R: Not examined L: Not examined  Palpation Pt reports pain to palpation near insertion of plantar fascia on plantar heel. She also has pain with palpation along posterior tibialis tendon just posterior and superior to R medial malleolus. Mild pain with deep palpation on both the dorsal and plantar surface of the 2nd MTP joint.   Passive Accessory Motion Deferred testing ankle. Possible slight hypermobility with dorsal to plantar mobilization of 2nd MTP joint but no overt gross laxity.  VASCULAR Deferred  SPECIAL TESTS Ligamentous Integrity Anterior Drawer (ATF, 10-15 plantarflexion with anterior translation): Negative Talar Tilt (CFL, inversion): Negative Eversion Stress Test (Deltoid, eversion): Negative External Rotation Test (High ankle, dorsiflexion and external rotation): Negative Squeeze Test (High ankle): Negative Impingment Sign (Dorsiflexion and eversion): Negative  Achilles Integrity Thompson Test: Negative  Fracture Screening Metatarsal Axial Loading: Negative Tap/Percussion Test: Not performed Vibration Test: Not performed  Pronation/Supination Navicular Drop: Negative  Nerve Test Tarsal Tunnel Test (maximal DF, EV, toe ext with tapping over tarsal tunnel): Not examined Test for Morton's Neuroma (compress  metatarsals and mobilize): Not examined  Other Windlass Mechanism Test: Negative   Beighton Scale Deferred   TODAY'S TREATMENT:   SUBJECTIVE: Pt reports that she is doing alright today. Continued L anterior tibial pain since last session. Her R ankle, plantar fascia, anterior tibial pain have been improving which is encouraging. No resting pain upon arrival.   PAIN: Denies resting pain;   Ther-ex  Supine L ankle isometric dorsiflexion, 10s hold x 5, pt only able to perform very light contraction before onset of pain; Supine R ankle inversion isometrics 5s hold x 5, progressed to eccentrics x 5 with pain toward end range eversion; Supine R ankle inversion isometrics toward end range eversion 5s hold x 5; Discussed home exercise progression/regression;   Manual Therapy   IASTM to R plantar fascia and L anterior tibilialis muscle; Attempted L plantarflexion stretch but no ant tib stretch localized so discontinued;   Trigger Point Dry Needling, unbilled Subsequent Treatment: Instructions provided previously at initial dry needling treatment.   Patient Verbal Consent Given: Yes Education Handout Provided: No Pt has had dry needling during prior episodes with this therapist Muscles Treated: In supine using clean technique TDN performed to L anterior tibialis with 2, 0.25 x 40 single needle placements with ache reported. Pistoning technique utilized.  Electrical Stimulation Performed: No Treatment Response/Outcome: Soreness   Not performed: Seated R towel scrunches for foot intrinsic strengthening x multiple bouts;    PATIENT EDUCATION:  Education details: Plan of care, updated HEP Person educated: Patient Education method: Explanation, Demonstration, Verbal cues, and Handouts Education comprehension: verbalized understanding and returned demonstration   HOME EXERCISE PROGRAM:  Access Code: [email or web address] URL: [email or web address] Date: 02/07/2024 Prepared by:  Crawford Dock  Exercises - Gastroc Stretch on Wall (Mirrored)  - 1-2 x daily - 7 x weekly - 3 sets - 10 reps - Soleus Stretch on Wall (Mirrored)  - 1-2 x daily - 7 x weekly - 3 sets - 10  reps - Towel Scrunches  - 1 x daily - 7 x weekly - 3 sets - 10 reps - Seated Calf Raise With Small Ball at Heels  - 1 x daily - 7 x weekly - 3 sets - 10 reps - Seated Ankle Inversion Eversion PROM (Mirrored)  - 1-2 x daily - 7 x weekly - 3 reps - 45-60s hold - Seated Figure 4 Ankle Inversion with Resistance  - 1 x daily - 3 x weekly - 3 sets - 5-10 reps   ASSESSMENT:  CLINICAL IMPRESSION: Progressed strengthening exercises during session today by performing a variety of isometrics and eccentrics. Repeated IASTM to R plantar fascia and L anterior tibialis muscle. Repeated TDN to L anterior tibialis. Discussed cross training options with patient as well as progressions/regressions that are part of her HEP. Pt encouraged to follow-up as scheduled. She will benefit from PT services to address deficits in strength, balance, and mobility in order to return to full function at home and decrease his risk for falls.    OBJECTIVE IMPAIRMENTS: pain.   ACTIVITY LIMITATIONS:  Running  PARTICIPATION LIMITATIONS:  Exercise  PERSONAL FACTORS: Time since onset of injury/illness/exacerbation and 1 comorbidity: anxiety  are also affecting patient's functional outcome.   REHAB POTENTIAL: Good  CLINICAL DECISION MAKING: Stable/uncomplicated  EVALUATION COMPLEXITY: Low   GOALS: Goals reviewed with patient? No  SHORT TERM GOALS: Target date: 02/21/2024  Pt will be independent with HEP to improve strength and decrease foot ankle pain to improve pain-free function at home, work, and with running. Baseline:  Goal status: INITIAL   LONG TERM GOALS: Target date: 03/20/2024  Pt will increase FADI to at least 5% points to demonstrate significant improvement in function at home, work, and with running related to foot and  ankle pain  Baseline: FADI Score: 95 / 104 or 91% Goal status: INITIAL  2.  Pt will report no further plantar foot and medial R ankle pain when running and afterwards in order to demonstrate clinically significant reduction in pain Baseline: worst: 3/10; Goal status: INITIAL  PLAN: PT FREQUENCY: 1x/week  PT DURATION: 8 weeks  PLANNED INTERVENTIONS: Therapeutic exercises, Therapeutic activity, Neuromuscular re-education, Balance training, Gait training, Patient/Family education, Self Care, Joint mobilization, Joint manipulation, Vestibular training, Canalith repositioning, Orthotic/Fit training, DME instructions, Dry Needling, Electrical stimulation, Spinal manipulation, Spinal mobilization, Cryotherapy, Moist heat, Taping, Traction, Ultrasound, Ionotophoresis 4mg /ml Dexamethasone, Manual therapy, and Re-evaluation.  PLAN FOR NEXT SESSION: assess response to TDN/STM for posterior tibial tendon, progress intrinsic and extrinsic foot strengthening   Sherill Ding Paz Fuentes PT, DPT, GCS  Trenise Turay, PT 02/14/2024, 10:38 AM

## 2024-02-14 ENCOUNTER — Ambulatory Visit: Payer: Self-pay

## 2024-02-14 DIAGNOSIS — M6281 Muscle weakness (generalized): Secondary | ICD-10-CM

## 2024-02-14 DIAGNOSIS — M25571 Pain in right ankle and joints of right foot: Secondary | ICD-10-CM | POA: Diagnosis not present

## 2024-02-20 NOTE — Therapy (Signed)
 OUTPATIENT PHYSICAL THERAPY ANKLE TREATMENT  Patient Name: Mallory Simmons MRN: [id number] DOB:13-Mar-1976, 48 y.o., female Today's Date: 02/21/2024  END OF SESSION:  PT End of Session - 02/21/24 0759     Visit Number 5    Number of Visits 9    Date for PT Re-Evaluation 03/20/24    Authorization Type eval: 01/24/24    PT Start Time 0800    PT Stop Time 0845    PT Time Calculation (min) 45 min    Activity Tolerance Patient tolerated treatment well    Behavior During Therapy [place] for tasks assessed/performed            Past Medical History:  Diagnosis Date   Depression    Hypothyroidism    Thyroid disease    Past Surgical History:  Procedure Laterality Date   COLONOSCOPY WITH PROPOFOL  N/A 11/14/2022   Procedure: COLONOSCOPY WITH PROPOFOL ;  Surgeon: Marnee Sink, MD;  Location: [facility];  Service: Endoscopy;  Laterality: N/A;   Patient Active Problem List   Diagnosis Date Noted   Encounter for screening colonoscopy 11/14/2022   Pain in left foot 04/06/2022   Valgus deformity of both great toes 04/06/2022   Essential hypertension 02/09/2022   Excessive bleeding in premenopausal period 10/02/2020   Acne 07/21/2020   Monoallelic mutation of ATM gene 27/25/3664   Restless legs 02/25/2017   Dysplastic nevus 12/30/2015   Hypertrophic scar 12/30/2015   Anxiety and depression 12/29/2015   Hypothyroidism 12/29/2015    PCP: Julius Ohs PA-C  REFERRING PROVIDER: Zettie Hillock PA-C  REFERRING DIAG: [phone] (ICD-10-CM) - Unspecified injury of right foot, initial encounter   Rationale for Evaluation and Treatment: Rehabilitation  THERAPY DIAG: Pain in right ankle and joints of right foot  Muscle weakness (generalized)  ONSET DATE: Approximately 9 months ago  FOLLOW-UP APPT SCHEDULED WITH REFERRING PROVIDER: No   FROM INITIAL EVALUATION SUBJECTIVE:                                                                                                                                                                                          SUBJECTIVE STATEMENT:  R plantar foot and medial ankle pain  PERTINENT HISTORY:  Pt reports experiencing chronic pain in her right second toe (both plantar and dorsal surface) for approximately nine months. She has previously been evaluated and was informed it might be a plantar plate tear or sprain. Pt has undergone significant rest and received a cortisone injection which provided temporary relief. She was also considering surgery (osteotomy) but eventually deferred for more conservative management. She is an active runner and the pain began sometime after participating  in a half marathon in April 2024 (not immediately after the race). She notices that the pain increases when she increases her running mileage however there has been a notable improvement in the pain since she was referred to physical therapy. The more notable pain she has been experiencing recently is located on the plantar surface of her foot near her heel as well as just posterior to her medial malleolus. Because of this pain she feels as if her gait is altered and she has less stability/balance on her RLE. As the second toe pain improved she reports walking frequently barefoot around the house. When running she uses Brooks running shoes with an OTC orthotic (shoes are less than 6 months old, unsure of mileage on current shoes). She was encouraged to try a metatarsal pad but states that she feels like this worsened her pain.  PAIN:    Pain Intensity: Present: 0/10, Best: 0/10, Worst: 3/10 Pain location: R plantar heel and posterior to medial malleolus Pain Quality: Achy, "extreme soreness" Radiating: No  Swelling: No  Popping, catching, locking: No  Numbness/Tingling: No Focal Weakness: No Aggravating factors: Stairs (more ascending), hiking, running (later into mileage) Relieving factors: Rest 24-hour pain behavior: More pain in plantar heel first thing  the morning, medial ankle pain with increase in mileage. 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  Recent memory is intact.  Remote memory is intact.  Attention span and concentration are intact.  Expressive speech is intact.  Patient's fund of knowledge is within normal limits for educational level.    Gross Musculoskeletal Assessment Bulk: Normal Tone: Normal No trophic changes noted to foot/ankle. No ecchymosis, erythema, or edema noted. No gross ankle/foot deformity noted. No hammertoes. Hallux valgus noted bilaterally but pt denies any associated pain;  GAIT: No excessive pronation/supination noted during gait. She maintains a good arch in WB positions. Plan to perform running assessment at future visit.   Posture: Full posture assessment deferred but no gross changes noted that would impact current complaints. Single leg balance demonstrates more instability on RLE.  AROM AROM (Normal range in degrees) AROM   Hip Right Left  Flexion (125)    Extension (15)    Abduction (40)    Adduction     Internal Rotation (45)    External Rotation (45)        Knee    Flexion (135) WNL WNL  Extension (0) WNL WNL      Ankle    Dorsiflexion (20) Deferred Deferred  Plantarflexion (50) 68 66   Inversion (35) 40 44  Eversion (15) 14 16  (* = pain; Blank rows = not tested)   LE MMT: MMT (out of 5) Right  Left   Hip flexion    Hip extension    Hip abduction 4+ 4+  Hip adduction 4+ 4+  Hip  internal rotation 5 5  Hip external rotation 5 5  Knee flexion 5 5  Knee extension 5 5  Ankle dorsiflexion 5 5  Ankle plantarflexion Strong Strong  Ankle inversion 5 5  Ankle eversion 5 5  (* = pain; Blank rows = not tested)  Sensation Deferred  Reflexes Deferred  Muscle Length Hamstrings: R: Not examined L: Not examined Quadriceps Amalia Badder): R: Not examined L: Not examined Hip flexors Andy Bannister): R: Not examined L: Not examined IT band Asencion Lawless): R: Not examined L: Not examined  Palpation Pt reports pain to palpation near insertion of plantar fascia on plantar heel. She also has pain with palpation along posterior tibialis tendon just posterior and superior to R medial malleolus. Mild pain with deep palpation on both the dorsal and plantar surface of the 2nd MTP joint.   Passive Accessory Motion Deferred testing ankle. Possible slight hypermobility with dorsal to plantar mobilization of 2nd MTP joint but no overt gross laxity.  VASCULAR Deferred  SPECIAL TESTS Ligamentous Integrity Anterior Drawer (ATF, 10-15 plantarflexion with anterior translation): Negative Talar Tilt (CFL, inversion): Negative Eversion Stress Test (Deltoid, eversion): Negative External Rotation Test (High ankle, dorsiflexion and external rotation): Negative Squeeze Test (High ankle): Negative Impingment Sign (Dorsiflexion and eversion): Negative  Achilles Integrity Thompson Test: Negative  Fracture Screening Metatarsal Axial Loading: Negative Tap/Percussion Test: Not performed Vibration Test: Not performed  Pronation/Supination Navicular Drop: Negative  Nerve Test Tarsal Tunnel Test (maximal DF, EV, toe ext with tapping over tarsal tunnel): Not examined Test for Morton's Neuroma (compress  metatarsals and mobilize): Not examined  Other Windlass Mechanism Test: Negative   Beighton Scale Deferred   TODAY'S TREATMENT:   SUBJECTIVE: Pt reports that she is doing well today. She has not been running lately and instead has been trying to rest in preparation for her [place] trip. Denies resting pain upon arrival.   PAIN: Denies resting pain;   Ther-ex  Supine ankle eccentric dorsiflexion x 10 BLE; Supine R ankle eccentric inversion x 10; Supine R ankle isotonic inversion x 10;   Manual Therapy   STM to bilateral anterior tibilialis muscle and R posterior tibialis; Supine L TC AP mobilizations, grade III, 20s/bout x 3 bouts;   Trigger Point Dry Needling, unbilled Subsequent Treatment: Instructions provided previously at initial dry needling treatment.   Patient Verbal Consent Given: Yes Education Handout Provided: No Pt has had dry needling during prior episodes with this therapist Muscles Treated: In supine using clean technique TDN performed to bilateral anterior tibialis with 4, 0.25 x 40 single needle placements with ache reported. Pistoning technique utilized. Also performed 2, 0.16 x 30 single needle placements in R tibialis posterior;  Electrical Stimulation Performed: No Treatment Response/Outcome: Soreness   Not performed: Seated R towel scrunches for foot intrinsic strengthening x multiple bouts;    PATIENT EDUCATION:  Education details: Plan of care; Person educated: Patient Education method: Programmer, multimedia, Facilities manager, Verbal cues, and Handouts Education comprehension: verbalized understanding and returned demonstration   HOME EXERCISE PROGRAM:  Access Code: [email or web address] URL: [email or web address] Date: 02/07/2024 Prepared by: Crawford Dock  Exercises - Gastroc Stretch on Wall (Mirrored)  - 1-2 x daily - 7 x weekly - 3 sets - 10 reps - Soleus Stretch on Wall (Mirrored)  - 1-2 x daily - 7 x weekly - 3 sets - 10 reps - Towel  Scrunches  - 1 x daily - 7 x weekly - 3 sets - 10 reps - Seated Calf Raise With Small Ball at Heels  -  1 x daily - 7 x weekly - 3 sets - 10 reps - Seated Ankle Inversion Eversion PROM (Mirrored)  - 1-2 x daily - 7 x weekly - 3 reps - 45-60s hold - Seated Figure 4 Ankle Inversion with Resistance  - 1 x daily - 3 x weekly - 3 sets - 5-10 reps   ASSESSMENT:  CLINICAL IMPRESSION: Progressed strengthening exercises during session today. Repeated IASTM to R tibialis posterior and bilateral anterior tibialis muscles. Repeated TDN to bilateral anterior tibialis and R tibialis posterior. She denies pain with isotonic inversion strengthening which is an improvement from prior sessions. Pt encouraged to follow-up as scheduled. She will benefit from PT services to address deficits in strength, balance, and mobility in order to return to full function at home and decrease his risk for falls.    OBJECTIVE IMPAIRMENTS: pain.   ACTIVITY LIMITATIONS:  Running  PARTICIPATION LIMITATIONS:  Exercise  PERSONAL FACTORS: Time since onset of injury/illness/exacerbation and 1 comorbidity: anxiety  are also affecting patient's functional outcome.   REHAB POTENTIAL: Good  CLINICAL DECISION MAKING: Stable/uncomplicated  EVALUATION COMPLEXITY: Low   GOALS: Goals reviewed with patient? No  SHORT TERM GOALS: Target date: 02/21/2024  Pt will be independent with HEP to improve strength and decrease foot ankle pain to improve pain-free function at home, work, and with running. Baseline:  Goal status: INITIAL   LONG TERM GOALS: Target date: 03/20/2024  Pt will increase FADI to at least 5% points to demonstrate significant improvement in function at home, work, and with running related to foot and ankle pain  Baseline: FADI Score: 95 / 104 or 91% Goal status: INITIAL  2.  Pt will report no further plantar foot and medial R ankle pain when running and afterwards in order to demonstrate clinically significant  reduction in pain Baseline: worst: 3/10; Goal status: INITIAL  PLAN: PT FREQUENCY: 1x/week  PT DURATION: 8 weeks  PLANNED INTERVENTIONS: Therapeutic exercises, Therapeutic activity, Neuromuscular re-education, Balance training, Gait training, Patient/Family education, Self Care, Joint mobilization, Joint manipulation, Vestibular training, Canalith repositioning, Orthotic/Fit training, DME instructions, Dry Needling, Electrical stimulation, Spinal manipulation, Spinal mobilization, Cryotherapy, Moist heat, Taping, Traction, Ultrasound, Ionotophoresis 4mg /ml Dexamethasone, Manual therapy, and Re-evaluation.  PLAN FOR NEXT SESSION: assess response to TDN/STM for posterior tibial tendon, progress intrinsic and extrinsic foot strengthening   Sherill Ding Merrick Maggio PT, DPT, GCS  Mysti Haley, PT 02/21/2024, 4:02 PM

## 2024-02-21 ENCOUNTER — Ambulatory Visit: Payer: Self-pay

## 2024-02-21 DIAGNOSIS — M25571 Pain in right ankle and joints of right foot: Secondary | ICD-10-CM | POA: Diagnosis not present

## 2024-02-21 DIAGNOSIS — M6281 Muscle weakness (generalized): Secondary | ICD-10-CM

## 2024-02-28 ENCOUNTER — Ambulatory Visit: Payer: Self-pay | Attending: Physician Assistant

## 2024-02-28 DIAGNOSIS — M6281 Muscle weakness (generalized): Secondary | ICD-10-CM | POA: Insufficient documentation

## 2024-02-28 DIAGNOSIS — M25571 Pain in right ankle and joints of right foot: Secondary | ICD-10-CM | POA: Diagnosis present

## 2024-02-28 DIAGNOSIS — M25572 Pain in left ankle and joints of left foot: Secondary | ICD-10-CM | POA: Insufficient documentation

## 2024-02-28 NOTE — Therapy (Unsigned)
 OUTPATIENT PHYSICAL THERAPY ANKLE TREATMENT  Patient Name: Mallory Simmons MRN: [id number] DOB:08/21/1976, 48 y.o., female Today's Date: 02/29/2024  END OF SESSION:  PT End of Session - 02/28/24 1521     Visit Number 6    Number of Visits 9    Date for PT Re-Evaluation 03/20/24    Authorization Type eval: 01/24/24    PT Start Time 0801    PT Stop Time 0845    PT Time Calculation (min) 44 min    Activity Tolerance Patient tolerated treatment well    Behavior During Therapy Mallory Simmons for tasks assessed/performed            Past Medical History:  Diagnosis Date   Depression    Hypothyroidism    Thyroid disease    Past Surgical History:  Procedure Laterality Date   COLONOSCOPY WITH PROPOFOL  N/A 11/14/2022   Procedure: COLONOSCOPY WITH PROPOFOL ;  Surgeon: Marnee Sink, MD;  Location: [facility];  Service: Endoscopy;  Laterality: N/A;   Patient Active Problem List   Diagnosis Date Noted   Encounter for screening colonoscopy 11/14/2022   Pain in left foot 04/06/2022   Valgus deformity of both great toes 04/06/2022   Essential hypertension 02/09/2022   Excessive bleeding in premenopausal period 10/02/2020   Acne 07/21/2020   Monoallelic mutation of ATM gene 40/98/1191   Restless legs 02/25/2017   Dysplastic nevus 12/30/2015   Hypertrophic scar 12/30/2015   Anxiety and depression 12/29/2015   Hypothyroidism 12/29/2015    PCP: Julius Ohs PA-C  REFERRING PROVIDER: Zettie Hillock PA-C  REFERRING DIAG: [phone] (ICD-10-CM) - Unspecified injury of right foot, initial encounter   Rationale for Evaluation and Treatment: Rehabilitation  THERAPY DIAG: Pain in right ankle and joints of right foot  Muscle weakness (generalized)  ONSET DATE: Approximately 9 months ago  FOLLOW-UP APPT SCHEDULED WITH REFERRING PROVIDER: No   FROM INITIAL EVALUATION SUBJECTIVE:                                                                                                                                                                                          SUBJECTIVE STATEMENT:  R plantar foot and medial ankle pain  PERTINENT HISTORY:  Pt reports experiencing chronic pain in her right second toe (both plantar and dorsal surface) for approximately nine months. She has previously been evaluated and was informed it might be a plantar plate tear or sprain. Pt has undergone significant rest and received a cortisone injection which provided temporary relief. She was also considering surgery (osteotomy) but eventually deferred for more conservative management. She is an active runner and the pain began sometime after participating  in a half marathon in April 2024 (not immediately after the race). She notices that the pain increases when she increases her running mileage however there has been a notable improvement in the pain since she was referred to physical therapy. The more notable pain she has been experiencing recently is located on the plantar surface of her foot near her heel as well as just posterior to her medial malleolus. Because of this pain she feels as if her gait is altered and she has less stability/balance on her RLE. As the second toe pain improved she reports walking frequently barefoot around the house. When running she uses Brooks running shoes with an OTC orthotic (shoes are less than 6 months old, unsure of mileage on current shoes). She was encouraged to try a metatarsal pad but states that she feels like this worsened her pain.  PAIN:    Pain Intensity: Present: 0/10, Best: 0/10, Worst: 3/10 Pain location: R plantar heel and posterior to medial malleolus Pain Quality: Achy, "extreme soreness" Radiating: No  Swelling: No  Popping, catching, locking: No  Numbness/Tingling: No Focal Weakness: No Aggravating factors: Stairs (more ascending), hiking, running (later into mileage) Relieving factors: Rest 24-hour pain behavior: More pain in plantar heel first thing  the morning, medial ankle pain with increase in mileage. 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  Recent memory is intact.  Remote memory is intact.  Attention span and concentration are intact.  Expressive speech is intact.  Patient's fund of knowledge is within normal limits for educational level.    Gross Musculoskeletal Assessment Bulk: Normal Tone: Normal No trophic changes noted to foot/ankle. No ecchymosis, erythema, or edema noted. No gross ankle/foot deformity noted. No hammertoes. Hallux valgus noted bilaterally but pt denies any associated pain;  GAIT: No excessive pronation/supination noted during gait. She maintains a good arch in WB positions. Plan to perform running assessment at future visit.   Posture: Full posture assessment deferred but no gross changes noted that would impact current complaints. Single leg balance demonstrates more instability on RLE.  AROM AROM (Normal range in degrees) AROM   Hip Right Left  Flexion (125)    Extension (15)    Abduction (40)    Adduction     Internal Rotation (45)    External Rotation (45)        Knee    Flexion (135) WNL WNL  Extension (0) WNL WNL      Ankle    Dorsiflexion (20) Deferred Deferred  Plantarflexion (50) 68 66   Inversion (35) 40 44  Eversion (15) 14 16  (* = pain; Blank rows = not tested)   LE MMT: MMT (out of 5) Right  Left   Hip flexion    Hip extension    Hip abduction 4+ 4+  Hip adduction 4+ 4+  Hip  internal rotation 5 5  Hip external rotation 5 5  Knee flexion 5 5  Knee extension 5 5  Ankle dorsiflexion 5 5  Ankle plantarflexion Strong Strong  Ankle inversion 5 5  Ankle eversion 5 5  (* = pain; Blank rows = not tested)  Sensation Deferred  Reflexes Deferred  Muscle Length Hamstrings: R: Not examined L: Not examined Quadriceps Amalia Badder): R: Not examined L: Not examined Hip flexors Andy Bannister): R: Not examined L: Not examined IT band Asencion Lawless): R: Not examined L: Not examined  Palpation Pt reports pain to palpation near insertion of plantar fascia on plantar heel. She also has pain with palpation along posterior tibialis tendon just posterior and superior to R medial malleolus. Mild pain with deep palpation on both the dorsal and plantar surface of the 2nd MTP joint.   Passive Accessory Motion Deferred testing ankle. Possible slight hypermobility with dorsal to plantar mobilization of 2nd MTP joint but no overt gross laxity.  VASCULAR Deferred  SPECIAL TESTS Ligamentous Integrity Anterior Drawer (ATF, 10-15 plantarflexion with anterior translation): Negative Talar Tilt (CFL, inversion): Negative Eversion Stress Test (Deltoid, eversion): Negative External Rotation Test (High ankle, dorsiflexion and external rotation): Negative Squeeze Test (High ankle): Negative Impingment Sign (Dorsiflexion and eversion): Negative  Achilles Integrity Thompson Test: Negative  Fracture Screening Metatarsal Axial Loading: Negative Tap/Percussion Test: Not performed Vibration Test: Not performed  Pronation/Supination Navicular Drop: Negative  Nerve Test Tarsal Tunnel Test (maximal DF, EV, toe ext with tapping over tarsal tunnel): Not examined Test for Morton's Neuroma (compress  metatarsals and mobilize): Not examined  Other Windlass Mechanism Test: Negative   Beighton Scale Deferred   TODAY'S TREATMENT:   SUBJECTIVE: Pt reports that she is doing well today. She has not been running as she is still trying to rest in preparation for her [place] trip. Denies resting pain upon arrival and has not had pain during daily activities. Still notes pain to palpation at medial R ankle;   PAIN: Denies resting pain;   Ther-ex  Supine R ankle manually resisted inversion 3 x 10; Supine R ankle manually resisted dorsiflexion 3 x 10; Supine R ankle manually resisted plantarflexion 3 x 10; Prostretch calf stretch 2 x 30s; Standing heel raises with ball between heels to promote inversion x 10; Standing heel raises in bent knee position with ball between    Manual Therapy   STM to R posterior tibialis muscle and tendon;   Trigger Point Dry Needling, unbilled Subsequent Treatment: Instructions provided previously at initial dry needling treatment.   Patient Verbal Consent Given: Yes Education Handout Provided: No Pt has had dry needling during prior episodes with this therapist Muscles Treated: In supine using clean technique TDN performed with 2, 0.16 x 30 single needle placements in R tibialis posterior. Pistoning technique utilized.  Electrical Stimulation Performed: No Treatment Response/Outcome: Soreness and ache   Not performed: Seated R towel scrunches for foot intrinsic strengthening x multiple bouts;    PATIENT EDUCATION:  Education details: Plan of care; Person educated: Patient Education method: Programmer, multimedia, Facilities manager, Verbal cues, and Handouts Education comprehension: verbalized understanding and returned demonstration   HOME EXERCISE PROGRAM:  Access Code: [email or web address] URL: [email or web address] Date: 02/07/2024 Prepared by: Crawford Dock  Exercises - Gastroc Stretch on Wall (Mirrored)  - 1-2 x daily - 7 x weekly - 3 sets -  10 reps - Soleus Stretch on Wall (Mirrored)  - 1-2 x daily - 7 x weekly - 3 sets - 10 reps - Towel Scrunches  -  1 x daily - 7 x weekly - 3 sets - 10 reps - Seated Calf Raise With Small Ball at Heels  - 1 x daily - 7 x weekly - 3 sets - 10 reps - Seated Ankle Inversion Eversion PROM (Mirrored)  - 1-2 x daily - 7 x weekly - 3 reps - 45-60s hold - Seated Figure 4 Ankle Inversion with Resistance  - 1 x daily - 3 x weekly - 3 sets - 5-10 reps   ASSESSMENT:  CLINICAL IMPRESSION: Progressed strengthening exercises during session today. Repeated IASTM to R tibialis posterior. Repeated TDN to R tibialis posterior. She denies pain with all strengthening which is an improvement from prior sessions. Pt encouraged to follow-up as scheduled. She will benefit from PT services to address deficits in strength, balance, and mobility in order to return to full function at home and decrease his risk for falls.    OBJECTIVE IMPAIRMENTS: pain.   ACTIVITY LIMITATIONS: Running  PARTICIPATION LIMITATIONS: Exercise  PERSONAL FACTORS: Time since onset of injury/illness/exacerbation and 1 comorbidity: anxiety are also affecting patient's functional outcome.   REHAB POTENTIAL: Good  CLINICAL DECISION MAKING: Stable/uncomplicated  EVALUATION COMPLEXITY: Low   GOALS: Goals reviewed with patient? No  SHORT TERM GOALS: Target date: 02/21/2024  Pt will be independent with HEP to improve strength and decrease foot ankle pain to improve pain-free function at home, work, and with running. Baseline:  Goal status: INITIAL   LONG TERM GOALS: Target date: 03/20/2024  Pt will increase FADI to at least 5% points to demonstrate significant improvement in function at home, work, and with running related to foot and ankle pain  Baseline: FADI Score: 95 / 104 or 91% Goal status: INITIAL  2.  Pt will report no further plantar foot and medial R ankle pain when running and afterwards in order to demonstrate clinically  significant reduction in pain Baseline: worst: 3/10; Goal status: INITIAL  PLAN: PT FREQUENCY: 1x/week  PT DURATION: 8 weeks  PLANNED INTERVENTIONS: Therapeutic exercises, Therapeutic activity, Neuromuscular re-education, Balance training, Gait training, Patient/Family education, Self Care, Joint mobilization, Joint manipulation, Vestibular training, Canalith repositioning, Orthotic/Fit training, DME instructions, Dry Needling, Electrical stimulation, Spinal manipulation, Spinal mobilization, Cryotherapy, Moist heat, Taping, Traction, Ultrasound, Ionotophoresis 4mg /ml Dexamethasone, Manual therapy, and Re-evaluation.  PLAN FOR NEXT SESSION: assess response to TDN/STM for posterior tibial tendon, progress intrinsic and extrinsic foot strengthening   Sherill Ding Travin Marik PT, DPT, GCS  Zira Helinski, PT 02/29/2024, 12:58 PM

## 2024-03-07 NOTE — Therapy (Incomplete)
 OUTPATIENT PHYSICAL THERAPY ANKLE TREATMENT  Patient Name: Mallory Simmons MRN: [id number] DOB:06-08-1976, 48 y.o., female Today's Date: 03/07/2024  END OF SESSION:   Past Medical History:  Diagnosis Date   Depression    Hypothyroidism    Thyroid disease    Past Surgical History:  Procedure Laterality Date   COLONOSCOPY WITH PROPOFOL  N/A 11/14/2022   Procedure: COLONOSCOPY WITH PROPOFOL ;  Surgeon: Marnee Sink, MD;  Location: [facility];  Service: Endoscopy;  Laterality: N/A;   Patient Active Problem List   Diagnosis Date Noted   Encounter for screening colonoscopy 11/14/2022   Pain in left foot 04/06/2022   Valgus deformity of both great toes 04/06/2022   Essential hypertension 02/09/2022   Excessive bleeding in premenopausal period 10/02/2020   Acne 07/21/2020   Monoallelic mutation of ATM gene 25/36/6440   Restless legs 02/25/2017   Dysplastic nevus 12/30/2015   Hypertrophic scar 12/30/2015   Anxiety and depression 12/29/2015   Hypothyroidism 12/29/2015    PCP: Julius Ohs PA-C  REFERRING PROVIDER: Zettie Hillock PA-C  REFERRING DIAG: [phone] (ICD-10-CM) - Unspecified injury of right foot, initial encounter   Rationale for Evaluation and Treatment: Rehabilitation  THERAPY DIAG: Pain in right ankle and joints of right foot  Muscle weakness (generalized)  ONSET DATE: Approximately 9 months ago  FOLLOW-UP APPT SCHEDULED WITH REFERRING PROVIDER: No   FROM INITIAL EVALUATION SUBJECTIVE:                                                                                                                                                                                         SUBJECTIVE STATEMENT:  R plantar foot and medial ankle pain  PERTINENT HISTORY:  Pt reports experiencing chronic pain in her right second toe (both plantar and dorsal surface) for approximately nine months. She has previously been evaluated and was informed it might be a plantar plate  tear or sprain. Pt has undergone significant rest and received a cortisone injection which provided temporary relief. She was also considering surgery (osteotomy) but eventually deferred for more conservative management. She is an active runner and the pain began sometime after participating in a half marathon in April 2024 (not immediately after the race). She notices that the pain increases when she increases her running mileage however there has been a notable improvement in the pain since she was referred to physical therapy. The more notable pain she has been experiencing recently is located on the plantar surface of her foot near her heel as well as just posterior to her medial malleolus. Because of this pain she feels as if her gait is altered and she has less stability/balance  on her RLE. As the second toe pain improved she reports walking frequently barefoot around the house. When running she uses Brooks running shoes with an OTC orthotic (shoes are less than 6 months old, unsure of mileage on current shoes). She was encouraged to try a metatarsal pad but states that she feels like this worsened her pain.  PAIN:    Pain Intensity: Present: 0/10, Best: 0/10, Worst: 3/10 Pain location: R plantar heel and posterior to medial malleolus Pain Quality: Achy, "extreme soreness" Radiating: No  Swelling: No  Popping, catching, locking: No  Numbness/Tingling: No Focal Weakness: No Aggravating factors: Stairs (more ascending), hiking, running (later into mileage) Relieving factors: Rest 24-hour pain behavior: More pain in plantar heel first thing the morning, medial ankle pain with increase in mileage. 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  Recent memory is intact.  Remote memory is intact.  Attention span and concentration are intact.  Expressive speech is intact.  Patient's fund of knowledge is within normal limits for educational level.    Gross Musculoskeletal Assessment Bulk: Normal Tone: Normal No trophic changes noted to foot/ankle. No ecchymosis, erythema, or edema noted. No gross ankle/foot deformity noted. No hammertoes. Hallux valgus noted bilaterally but pt denies any associated pain;  GAIT: No excessive pronation/supination noted during gait. She maintains a good arch in WB positions. Plan to perform running assessment at future visit.   Posture: Full posture assessment deferred but no gross changes noted that would impact current complaints. Single leg balance demonstrates more instability on RLE.  AROM AROM (Normal range in degrees) AROM   Hip Right Left  Flexion (125)    Extension (15)    Abduction (40)    Adduction     Internal Rotation (45)    External Rotation (45)        Knee    Flexion (135) WNL WNL  Extension (0) WNL WNL      Ankle    Dorsiflexion (20) Deferred Deferred  Plantarflexion (50) 68 66  Inversion (35) 40 44  Eversion (15) 14 16  (* = pain; Blank rows = not tested)   LE MMT: MMT (out of 5) Right  Left   Hip flexion    Hip extension    Hip abduction 4+ 4+  Hip adduction 4+ 4+  Hip internal rotation 5 5  Hip external rotation 5 5  Knee flexion 5 5  Knee extension 5 5  Ankle dorsiflexion 5 5  Ankle plantarflexion Strong  Strong  Ankle inversion 5 5  Ankle eversion 5 5  (* = pain; Blank rows = not tested)  Sensation Deferred  Reflexes Deferred  Muscle Length Hamstrings: R: Not examined L: Not examined Quadriceps Amalia Badder): R: Not examined L: Not examined Hip flexors Andy Bannister): R: Not examined L: Not examined IT band Asencion Lawless): R: Not examined  L: Not examined  Palpation Pt reports pain to palpation near insertion of plantar fascia on plantar heel. She also has pain with palpation along posterior tibialis tendon just posterior and superior to R medial malleolus. Mild pain with deep palpation on both the dorsal and plantar surface of the 2nd MTP joint.   Passive Accessory Motion Deferred testing ankle. Possible slight hypermobility with dorsal to plantar mobilization of 2nd MTP joint but no overt gross laxity.  VASCULAR Deferred  SPECIAL TESTS Ligamentous Integrity Anterior Drawer (ATF, 10-15 plantarflexion with anterior translation): Negative Talar Tilt (CFL, inversion): Negative Eversion Stress Test (Deltoid, eversion): Negative External Rotation Test (High ankle, dorsiflexion and external rotation): Negative Squeeze Test (High ankle): Negative Impingment Sign (Dorsiflexion and eversion): Negative  Achilles Integrity Thompson Test: Negative  Fracture Screening Metatarsal Axial Loading: Negative Tap/Percussion Test: Not performed Vibration Test: Not performed  Pronation/Supination Navicular Drop: Negative  Nerve Test Tarsal Tunnel Test (maximal DF, EV, toe ext with tapping over tarsal tunnel): Not examined Test for Morton's Neuroma (compress metatarsals and mobilize): Not examined  Other Windlass Mechanism Test: Negative   Beighton Scale Deferred   TODAY'S TREATMENT:   SUBJECTIVE: Pt reports that she is doing well today. She has not been running as she is still trying to rest in preparation for her [place] trip. Denies resting pain upon arrival and has not had pain during daily  activities. Still notes pain to palpation at medial R ankle;   PAIN: Denies resting pain;   Ther-ex  Supine R ankle manually resisted inversion 3 x 10; Supine R ankle manually resisted dorsiflexion 3 x 10; Supine R ankle manually resisted plantarflexion 3 x 10; Prostretch calf stretch 2 x 30s; Standing heel raises with ball between heels to promote inversion x 10; Standing heel raises in bent knee position with ball between    Manual Therapy   STM to R posterior tibialis muscle and tendon;   Trigger Point Dry Needling, unbilled Subsequent Treatment: Instructions provided previously at initial dry needling treatment.   Patient Verbal Consent Given: Yes Education Handout Provided: No Pt has had dry needling during prior episodes with this therapist Muscles Treated: In supine using clean technique TDN performed with 2, 0.16 x 30 single needle placements in R tibialis posterior. Pistoning technique utilized.  Electrical Stimulation Performed: No Treatment Response/Outcome: Soreness and ache   Not performed: Seated R towel scrunches for foot intrinsic strengthening x multiple bouts;    PATIENT EDUCATION:  Education details: Plan of care; Person educated: Patient Education method: Programmer, multimedia, Facilities manager, Verbal cues, and Handouts Education comprehension: verbalized understanding and returned demonstration   HOME EXERCISE PROGRAM:  Access Code: [email or web address] URL: [email or web address] Date: 02/07/2024 Prepared by: Crawford Dock  Exercises - Gastroc Stretch on Wall (Mirrored)  - 1-2 x daily - 7 x weekly - 3 sets - 10 reps - Soleus Stretch on Wall (Mirrored)  - 1-2 x daily - 7 x weekly - 3 sets - 10 reps - Towel Scrunches  - 1 x daily - 7 x weekly - 3 sets - 10 reps - Seated Calf Raise With Small Ball at Heels  - 1 x daily - 7 x weekly - 3 sets - 10 reps - Seated Ankle Inversion Eversion PROM (Mirrored)  - 1-2 x daily - 7 x weekly - 3 reps - 45-60s hold -  Seated Figure 4 Ankle Inversion with Resistance  - 1 x daily - 3 x weekly - 3 sets - 5-10 reps   ASSESSMENT:  CLINICAL  IMPRESSION: Progressed strengthening exercises during session today. Repeated IASTM to R tibialis posterior. Repeated TDN to R tibialis posterior. She denies pain with all strengthening which is an improvement from prior sessions. Pt encouraged to follow-up as scheduled. She will benefit from PT services to address deficits in strength, balance, and mobility in order to return to full function at home and decrease his risk for falls.    OBJECTIVE IMPAIRMENTS: pain.   ACTIVITY LIMITATIONS: Running  PARTICIPATION LIMITATIONS: Exercise  PERSONAL FACTORS: Time since onset of injury/illness/exacerbation and 1 comorbidity: anxiety are also affecting patient's functional outcome.   REHAB POTENTIAL: Good  CLINICAL DECISION MAKING: Stable/uncomplicated  EVALUATION COMPLEXITY: Low   GOALS: Goals reviewed with patient? No  SHORT TERM GOALS: Target date: 02/21/2024  Pt will be independent with HEP to improve strength and decrease foot ankle pain to improve pain-free function at home, work, and with running. Baseline:  Goal status: INITIAL   LONG TERM GOALS: Target date: 03/20/2024  Pt will increase FADI to at least 5% points to demonstrate significant improvement in function at home, work, and with running related to foot and ankle pain  Baseline: FADI Score: 95 / 104 or 91% Goal status: INITIAL  2.  Pt will report no further plantar foot and medial R ankle pain when running and afterwards in order to demonstrate clinically significant reduction in pain Baseline: worst: 3/10; Goal status: INITIAL  PLAN: PT FREQUENCY: 1x/week  PT DURATION: 8 weeks  PLANNED INTERVENTIONS: Therapeutic exercises, Therapeutic activity, Neuromuscular re-education, Balance training, Gait training, Patient/Family education, Self Care, Joint mobilization, Joint manipulation, Vestibular  training, Canalith repositioning, Orthotic/Fit training, DME instructions, Dry Needling, Electrical stimulation, Spinal manipulation, Spinal mobilization, Cryotherapy, Moist heat, Taping, Traction, Ultrasound, Ionotophoresis 4mg /ml Dexamethasone, Manual therapy, and Re-evaluation.  PLAN FOR NEXT SESSION: assess response to TDN/STM for posterior tibial tendon, progress intrinsic and extrinsic foot strengthening   Sherill Ding Slevin Gunby PT, DPT, GCS  Elleanna Melling, PT 03/07/2024, 5:02 PM

## 2024-03-12 ENCOUNTER — Ambulatory Visit: Payer: Self-pay

## 2024-03-12 DIAGNOSIS — M6281 Muscle weakness (generalized): Secondary | ICD-10-CM

## 2024-03-12 DIAGNOSIS — M25571 Pain in right ankle and joints of right foot: Secondary | ICD-10-CM

## 2024-03-15 ENCOUNTER — Ambulatory Visit

## 2024-03-15 DIAGNOSIS — M25572 Pain in left ankle and joints of left foot: Secondary | ICD-10-CM

## 2024-03-15 DIAGNOSIS — M25571 Pain in right ankle and joints of right foot: Secondary | ICD-10-CM

## 2024-03-15 DIAGNOSIS — M6281 Muscle weakness (generalized): Secondary | ICD-10-CM

## 2024-03-15 NOTE — Therapy (Signed)
 OUTPATIENT PHYSICAL THERAPY ANKLE TREATMENT/DISCHARGE  Patient Name: Mallory Simmons MRN: [id number] DOB:November 07, 1975, 48 y.o., female Today's Date: 03/15/2024  END OF SESSION:  PT End of Session - 03/15/24 0845     Visit Number 7    Number of Visits 9    Date for PT Re-Evaluation 03/20/24    Authorization Type eval: 01/24/24    PT Start Time 0845    PT Stop Time 0930    PT Time Calculation (min) 45 min    Activity Tolerance Patient tolerated treatment well    Behavior During Therapy [facility] for tasks assessed/performed            Past Medical History:  Diagnosis Date   Depression    Hypothyroidism    Thyroid disease    Past Surgical History:  Procedure Laterality Date   COLONOSCOPY WITH PROPOFOL  N/A 11/14/2022   Procedure: COLONOSCOPY WITH PROPOFOL ;  Surgeon: Marnee Sink, MD;  Location: [facility];  Service: Endoscopy;  Laterality: N/A;   Patient Active Problem List   Diagnosis Date Noted   Encounter for screening colonoscopy 11/14/2022   Pain in left foot 04/06/2022   Valgus deformity of both great toes 04/06/2022   Essential hypertension 02/09/2022   Excessive bleeding in premenopausal period 10/02/2020   Acne 07/21/2020   Monoallelic mutation of ATM gene 82/95/6213   Restless legs 02/25/2017   Dysplastic nevus 12/30/2015   Hypertrophic scar 12/30/2015   Anxiety and depression 12/29/2015   Hypothyroidism 12/29/2015    PCP: Julius Ohs PA-C  REFERRING PROVIDER: Zettie Hillock PA-C  REFERRING DIAG: [phone] (ICD-10-CM) - Unspecified injury of right foot, initial encounter   Rationale for Evaluation and Treatment: Rehabilitation  THERAPY DIAG: Pain in right ankle and joints of right foot  Muscle weakness (generalized)  Pain in left ankle and joints of left foot  ONSET DATE: Approximately 9 months ago  FOLLOW-UP APPT SCHEDULED WITH REFERRING PROVIDER: No   FROM INITIAL EVALUATION SUBJECTIVE:                                                                                                                                                                                          SUBJECTIVE STATEMENT:  R plantar foot and medial ankle pain  PERTINENT HISTORY:  Pt reports experiencing chronic pain in her right second toe (both plantar and dorsal surface) for approximately nine months. She has previously been evaluated and was informed it might be a plantar plate tear or sprain. Pt has undergone significant rest and received a cortisone injection which provided temporary relief. She was also considering surgery (osteotomy) but eventually deferred for more conservative management. She is  an active runner and the pain began sometime after participating in a half marathon in April 2024 (not immediately after the race). She notices that the pain increases when she increases her running mileage however there has been a notable improvement in the pain since she was referred to physical therapy. The more notable pain she has been experiencing recently is located on the plantar surface of her foot near her heel as well as just posterior to her medial malleolus. Because of this pain she feels as if her gait is altered and she has less stability/balance on her RLE. As the second toe pain improved she reports walking frequently barefoot around the house. When running she uses Brooks running shoes with an OTC orthotic (shoes are less than 6 months old, unsure of mileage on current shoes). She was encouraged to try a metatarsal pad but states that she feels like this worsened her pain.  PAIN:    Pain Intensity: Present: 0/10, Best: 0/10, Worst: 3/10 Pain location: R plantar heel and posterior to medial malleolus Pain Quality: Achy, "extreme soreness" Radiating: No  Swelling: No  Popping, catching, locking: No  Numbness/Tingling: No Focal Weakness: No Aggravating factors: Stairs (more ascending), hiking, running (later into mileage) Relieving factors:  Rest 24-hour pain behavior: More pain in plantar heel first thing the morning, medial ankle pain with increase in mileage. 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  Recent memory is intact.  Remote memory is intact.  Attention span and concentration are intact.  Expressive speech is intact.  Patient's fund of knowledge is within normal limits for educational level.    Gross Musculoskeletal Assessment Bulk: Normal Tone: Normal No trophic changes noted to foot/ankle. No ecchymosis, erythema, or edema noted. No gross ankle/foot deformity noted. No hammertoes. Hallux valgus noted bilaterally but pt denies any associated pain;  GAIT: No excessive pronation/supination noted during gait. She maintains a good arch in WB positions. Plan to perform running assessment at future visit.   Posture: Full posture assessment deferred but no gross changes noted that would impact current complaints. Single leg balance demonstrates more instability on RLE.  AROM AROM (Normal range in degrees) AROM   Hip Right Left  Flexion (125)    Extension (15)    Abduction (40)    Adduction     Internal Rotation (45)    External Rotation (45)        Knee    Flexion (135) WNL WNL  Extension (0) WNL WNL      Ankle     Dorsiflexion (20) Deferred Deferred  Plantarflexion (50) 68 66  Inversion (35) 40 44  Eversion (15) 14 16  (* = pain; Blank rows = not tested)   LE MMT: MMT (out of 5) Right  Left   Hip flexion    Hip extension    Hip  abduction 4+ 4+  Hip adduction 4+ 4+  Hip internal rotation 5 5  Hip external rotation 5 5  Knee flexion 5 5  Knee extension 5 5  Ankle dorsiflexion 5 5  Ankle plantarflexion Strong Strong  Ankle inversion 5 5  Ankle eversion 5 5  (* = pain; Blank rows = not tested)  Sensation Deferred  Reflexes Deferred  Muscle Length Hamstrings: R: Not examined L: Not examined Quadriceps Amalia Badder): R: Not examined L: Not examined Hip flexors Andy Bannister): R: Not examined L: Not examined IT band Asencion Lawless): R: Not examined L: Not examined  Palpation Pt reports pain to palpation near insertion of plantar fascia on plantar heel. She also has pain with palpation along posterior tibialis tendon just posterior and superior to R medial malleolus. Mild pain with deep palpation on both the dorsal and plantar surface of the 2nd MTP joint.   Passive Accessory Motion Deferred testing ankle. Possible slight hypermobility with dorsal to plantar mobilization of 2nd MTP joint but no overt gross laxity.  VASCULAR Deferred  SPECIAL TESTS Ligamentous Integrity Anterior Drawer (ATF, 10-15 plantarflexion with anterior translation): Negative Talar Tilt (CFL, inversion): Negative Eversion Stress Test (Deltoid, eversion): Negative External Rotation Test (High ankle, dorsiflexion and external rotation): Negative Squeeze Test (High ankle): Negative Impingment Sign (Dorsiflexion and eversion): Negative  Achilles Integrity Thompson Test: Negative  Fracture Screening Metatarsal Axial Loading: Negative Tap/Percussion Test: Not performed Vibration Test: Not performed  Pronation/Supination Navicular Drop: Negative  Nerve Test Tarsal Tunnel Test (maximal DF, EV, toe ext with tapping over  tarsal tunnel): Not examined Test for Morton's Neuroma (compress metatarsals and mobilize): Not examined  Other Windlass Mechanism Test: Negative   Beighton Scale Deferred   TODAY'S TREATMENT:   SUBJECTIVE: Pt reports that she is doing well today. She did not experience any pain while she was in [place] and has had a couple light runs since being home without pain. Denies resting pain upon arrival.   PAIN: Denies resting pain;   Ther-ex  Treadmill cadence training with walk warm-up followed by tempo pace around 4. and working on cadence of 160 spm (her baseline cadence per patient's watch is 141 spm). Cues to decrease stride length and increase turnover;  Supine R ankle manually resisted inversion 2 x 10; Supine R ankle manually resisted eversion 2 x 10; Supine R ankle manually resisted dorsiflexion 2 x 10; Supine R ankle manually resisted plantarflexion 2 x 10; Discharge education provided;   Manual Therapy   STM to R posterior tibialis muscle and tendon as well as R plantar fascia.  Passive R DF stretch x 45s;    PATIENT EDUCATION:  Education details: Discharge Person educated: Patient Education method: Explanation, Demonstration, Verbal cues, and Handouts Education comprehension: verbalized understanding and returned demonstration   HOME EXERCISE PROGRAM:  Access Code: [email or web address] URL: [email or web address] Date: 02/07/2024 Prepared by: Crawford Dock  Exercises - Gastroc Stretch on Wall (Mirrored)  - 1-2 x daily - 7 x weekly - 3 sets - 10 reps - Soleus Stretch on Wall (Mirrored)  - 1-2 x daily - 7 x weekly - 3 sets - 10 reps - Towel Scrunches  - 1 x daily - 7 x weekly - 3 sets - 10 reps - Seated Calf Raise With Small Ball at Heels  - 1 x daily - 7 x weekly - 3 sets - 10 reps - Seated Ankle Inversion Eversion PROM (Mirrored)  - 1-2 x daily - 7 x weekly - 3 reps - 45-60s hold -  Seated Figure 4 Ankle Inversion with Resistance  - 1 x daily - 3 x  weekly - 3 sets - 5-10 reps   ASSESSMENT:  CLINICAL IMPRESSION: Pt reporting resolution of her pain at this time. She did take a prolonged rest and has been gradually returning to running. Discussed slow progression and practiced increasing cadence on treadmill today to shorten stride length and decrease contact time. Progressed strengthening exercises during session today and pt denies pain. She has met her goals and will be discharged today. Pt encouraged to return if symptoms recur.  OBJECTIVE IMPAIRMENTS: pain.   ACTIVITY LIMITATIONS: Running  PARTICIPATION LIMITATIONS: Exercise  PERSONAL FACTORS: Time since onset of injury/illness/exacerbation and 1 comorbidity: anxiety are also affecting patient's functional outcome.   REHAB POTENTIAL: Good  CLINICAL DECISION MAKING: Stable/uncomplicated  EVALUATION COMPLEXITY: Low   GOALS: Goals reviewed with patient? No  SHORT TERM GOALS: Target date: 02/21/2024  Pt will be independent with HEP to improve strength and decrease foot ankle pain to improve pain-free function at home, work, and with running. Baseline:  Goal status: INITIAL   LONG TERM GOALS: Target date: 03/20/2024  Pt will increase FADI to at least 5% points to demonstrate significant improvement in function at home, work, and with running related to foot and ankle pain  Baseline: FADI Score: 95 / 104 or 91% Goal status: DEFERRED  2.  Pt will report no further plantar foot and medial R ankle pain when running and afterwards in order to demonstrate clinically significant reduction in pain Baseline: worst: 3/10; 03/15/24: 0/10; Goal status: ACHIEVED;  PLAN: PT FREQUENCY: 1x/week  PT DURATION: 8 weeks  PLANNED INTERVENTIONS: Therapeutic exercises, Therapeutic activity, Neuromuscular re-education, Balance training, Gait training, Patient/Family education, Self Care, Joint mobilization, Joint manipulation, Vestibular training, Canalith repositioning, Orthotic/Fit  training, DME instructions, Dry Needling, Electrical stimulation, Spinal manipulation, Spinal mobilization, Cryotherapy, Moist heat, Taping, Traction, Ultrasound, Ionotophoresis 4mg /ml Dexamethasone, Manual therapy, and Re-evaluation.  PLAN FOR NEXT SESSION:  Discharge   Candi Chafe PT, DPT, GCS  Roey Coopman, PT 03/15/2024, 12:23 PM
